# Patient Record
Sex: Male | Born: 1940 | Race: Black or African American | Hispanic: No | Marital: Single | Smoking: Former smoker
Health system: Southern US, Community
[De-identification: ages and names within clinical notes are randomized; demographics above are authoritative.]

## PROBLEM LIST (undated history)

## (undated) DIAGNOSIS — I10 Essential (primary) hypertension: Secondary | ICD-10-CM

## (undated) HISTORY — PX: EYE SURGERY: SHX253

## (undated) HISTORY — PX: NASAL SINUS SURGERY: SHX719

## (undated) HISTORY — DX: Essential (primary) hypertension: I10

---

## 2011-07-29 ENCOUNTER — Encounter: Payer: Self-pay | Admitting: Internal Medicine

## 2011-07-29 ENCOUNTER — Other Ambulatory Visit: Payer: Self-pay | Admitting: Gastroenterology

## 2011-07-29 ENCOUNTER — Inpatient Hospital Stay (HOSPITAL_COMMUNITY)
Admission: EM | Admit: 2011-07-29 | Discharge: 2011-08-01 | DRG: 379 | Disposition: A | Discharge status: Home / Self Care | Payer: Medicare Other | Source: Ambulatory Visit | Attending: Internal Medicine | Admitting: Internal Medicine

## 2011-07-29 DIAGNOSIS — Z79899 Other long term (current) drug therapy: Secondary | ICD-10-CM

## 2011-07-29 DIAGNOSIS — Z880 Allergy status to penicillin: Secondary | ICD-10-CM

## 2011-07-29 DIAGNOSIS — K5731 Diverticulosis of large intestine without perforation or abscess with bleeding: Principal | ICD-10-CM | POA: Diagnosis present

## 2011-07-29 DIAGNOSIS — Z23 Encounter for immunization: Secondary | ICD-10-CM

## 2011-07-29 DIAGNOSIS — K259 Gastric ulcer, unspecified as acute or chronic, without hemorrhage or perforation: Secondary | ICD-10-CM | POA: Diagnosis present

## 2011-07-29 DIAGNOSIS — M1710 Unilateral primary osteoarthritis, unspecified knee: Secondary | ICD-10-CM | POA: Diagnosis present

## 2011-07-29 DIAGNOSIS — I10 Essential (primary) hypertension: Secondary | ICD-10-CM | POA: Diagnosis present

## 2011-07-29 LAB — LIPID PANEL
HDL: 49 mg/dL (ref >39)
LDL Cholesterol: 75 mg/dL (ref 0–99)
VLDL: 6 mg/dL (ref 0–40)

## 2011-07-29 LAB — DIFFERENTIAL
Eosinophils Relative: 1 % (ref 0–5)
Lymphocytes Relative: 27 % (ref 12–46)
Lymphs Abs: 1.6 10*3/uL (ref 0.7–4.0)
Monocytes Absolute: 0.7 10*3/uL (ref 0.1–1.0)
Monocytes Relative: 12 % (ref 3–12)

## 2011-07-29 LAB — CBC
HCT: 28.5 % — ABNORMAL LOW (ref 39.0–52.0)
HCT: 27.8 % — ABNORMAL LOW (ref 39.0–52.0)
HCT: 28 % — ABNORMAL LOW (ref 39.0–52.0)
Hemoglobin: 9.3 g/dL — ABNORMAL LOW (ref 13.0–17.0)
Hemoglobin: 8.5 g/dL — ABNORMAL LOW (ref 13.0–17.0)
MCH: 29.6 pg (ref 26.0–34.0)
MCHC: 32.6 g/dL (ref 30.0–36.0)
MCHC: 32.5 g/dL (ref 30.0–36.0)
MCHC: 32.8 g/dL (ref 30.0–36.0)
MCV: 91.6 fL (ref 78.0–100.0)
Platelets: 145 10*3/uL — ABNORMAL LOW (ref 150–400)
Platelets: 137 10*3/uL — ABNORMAL LOW (ref 150–400)
RDW: 13.9 % (ref 11.5–15.5)
RDW: 13.8 % (ref 11.5–15.5)
RDW: 13.8 % (ref 11.5–15.5)
RDW: 13.7 % (ref 11.5–15.5)
WBC: 7.4 10*3/uL (ref 4.0–10.5)
WBC: 8.2 10*3/uL (ref 4.0–10.5)

## 2011-07-29 LAB — ABO/RH: ABO/RH(D): O POS

## 2011-07-29 LAB — COMPREHENSIVE METABOLIC PANEL
Albumin: 2.7 g/dL — ABNORMAL LOW (ref 3.5–5.2)
BUN: 26 mg/dL — ABNORMAL HIGH (ref 6–23)
Creatinine, Ser: 0.94 mg/dL (ref 0.50–1.35)
GFR calc Af Amer: >90 mL/min (ref >90)
Total Bilirubin: 0.2 mg/dL — ABNORMAL LOW (ref 0.3–1.2)
Total Protein: 4.9 g/dL — ABNORMAL LOW (ref 6.0–8.3)

## 2011-07-29 LAB — APTT: aPTT: 26 seconds (ref 24–37)

## 2011-07-29 LAB — PROTIME-INR
INR: 1.16 (ref 0.00–1.49)
Prothrombin Time: 15 seconds (ref 11.6–15.2)

## 2011-07-29 LAB — HEMOGLOBIN A1C
Hgb A1c MFr Bld: 5.8 % — ABNORMAL HIGH (ref <5.7)
Mean Plasma Glucose: 120 mg/dL — ABNORMAL HIGH (ref <117)

## 2011-07-29 LAB — TSH: TSH: 0.544 u[IU]/mL (ref 0.350–4.500)

## 2011-07-29 LAB — BASIC METABOLIC PANEL
Calcium: 8 mg/dL — ABNORMAL LOW (ref 8.4–10.5)
GFR calc Af Amer: >90 mL/min (ref >90)
GFR calc non Af Amer: 89 mL/min — ABNORMAL LOW (ref >90)
Potassium: 3.3 mEq/L — ABNORMAL LOW (ref 3.5–5.1)
Sodium: 139 mEq/L (ref 135–145)

## 2011-07-29 LAB — CK TOTAL AND CKMB (NOT AT ARMC): Relative Index: 1.5 (ref 0.0–2.5)

## 2011-07-29 LAB — CARDIAC PANEL(CRET KIN+CKTOT+MB+TROPI)
CK, MB: 3.8 ng/mL (ref 0.3–4.0)
Relative Index: 1.5 (ref 0.0–2.5)
Total CK: 248 U/L — ABNORMAL HIGH (ref 7–232)

## 2011-07-29 LAB — LIPASE, BLOOD: Lipase: 24 U/L (ref 11–59)

## 2011-07-29 LAB — MRSA PCR SCREENING: MRSA by PCR: NEGATIVE

## 2011-07-29 NOTE — H&P (Signed)
Hospital Admission Note Date: 07/29/2011  Patient name:  Luis Sellers   Medical record number:  [id number] Date of birth:  1940-12-24  Age: 70 y.o. Gender:  male PCP:    No primary provider on file.  Medical Service:   Internal Medicine Teaching Service   Attending physician:  Dr. Cliffton Asters First Contact:   Dr. Manson Passey  Pager: [phone]  Second Contact:   Dr. Saralyn Pilar Pager: [phone] After Hours:    First Contact   Pager: [phone]      Second Contact  Pager: [phone]   Chief Complaint: BRBPR  History of Present Illness: Patient is a 70 y.o. male with a PMHx of HTN, who presents to [facility] for evaluation of BRBPR. He was perfectly fine until yesterday afternoon when he noticed dark brown and intermittent bright red blood mixed in his stool. Since then he had 3 other bowel movements with fresh blood in all of them. The bleeding did decrease for a while it seemed like it was increasing again today. He has intermittent constipation but he was having loose stool and was not constipated prior to the onset of symptoms. He did not feel any pain in his belly or rectal area when he wiped after passing a bowel movement. He did feel lightheaded after he walked into the ER this morning but not otherwise. He denies any chest pain, shortness of breath, fainting spells, fatigue or nausea or vomiting. He denies history of acid reflux. 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 He denies blood in stool or dark brown stool before. He denies weight loss. He has not had anything to eat since yesterday afternoon because he didn't feel like it. He never had a colonoscopy before. He does not take any medicine except Advil at home. He does not have any bleeding from his nose, skin or any other site.  Current Outpatient Medications: Advil: 2 tablets a day for past many years for arthritis  Allergies: Allergies not on file   Past Medical  History: Past Medical History  Diagnosis Date  . Hypertension     not on any antihypertensives: Was diagnosed by an urgent care center and patient did start taking an antihypertensive prescribed by them for some time but stopped after that. He does not remember the name of the antihypertensive.   -   arthritis of knees     Past Surgical History: Surgery for nasal blockage in remote past  Family History: Positive for diabetes in his father and neck cancer in mother. No history of colon cancer in the family.  Social History:  Lives in [place] . Single. 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 Has Medicare. Denies current smoking, alcohol or illicit drug use. Quit smoking and alcohol 25 years ago  Review of Systems: Constitutional:  Denies fever, chills, diaphoresis, appetite change and fatigue.  HEENT: Denies photophobia, eye pain, redness, hearing loss, ear pain, congestion, sore throat, rhinorrhea, sneezing, mouth sores, trouble swallowing, neck pain, neck stiffness and tinnitus.  Respiratory: Denies SOB, DOE, cough, chest tightness, and wheezing.  Cardiovascular: Denies chest pain, palpitations and leg swelling.  Gastrointestinal: Denies nausea, vomiting, abdominal pain, diarrhea, constipation, and abdominal distention.  Genitourinary: Denies dysuria, urgency, frequency, hematuria, flank pain and difficulty urinating.  Musculoskeletal: Denies myalgias, back pain, joint swelling, arthralgias and gait problem.   Skin: Denies pallor, rash and wound.  Neurological: Denies dizziness, seizures, syncope, weakness, light-headedness,  numbness and headaches.   Hematological: Denies adenopathy. Easy bruising, personal or family bleeding history.  Psychiatric/ Behavioral: Denies suicidal ideation, mood changes, confusion, nervousness, sleep disturbance and agitation.    Vital Signs: T: 97.8 P: 66-76 BP: 107-136/50-90 RR: 13 O2 sat: 100% RA   Physical Exam: General:  Vital signs reviewed and noted. Well-developed, well-nourished, in no acute distress; alert, appropriate and cooperative throughout examination.  Head: Normocephalic, atraumatic.  Eyes: PERRL, EOMI, mucous membranes pale   Nose: Mucous membranes moist, not inflammed, nonerythematous.  Throat: Oropharynx nonerythematous, no exudate appreciated.   Neck: No deformities, masses, or tenderness noted.Supple, No carotid Bruits, no JVD.  Lungs:  Normal respiratory effort. Clear to auscultation BL without crackles or wheezes.  Heart: RRR. S1 and S2 normal without gallop, murmur, or rubs.  Abdomen:  BS normoactive. Soft, Nondistended, non-tender.  No masses or organomegaly.  Extremities: No pretibial edema.  Neurologic: A&O X3, CN II - XII are grossly intact. Motor strength is 5/5 in the all 4 extremities, Sensations intact to light touch, Cerebellar signs negative.  Rectal Non tender, no masses, gross blood in rectum  Skin: No visible rashes, scars.   Lab results: Basic Metabolic Panel: Recent Labs  [facility] 07/29/11 0735   NA 140   K 3.4*   CL 107   CO2 26   GLUCOSE 166*   BUN 26*   CREATININE 0.94   CALCIUM 8.2*   MG --   PHOS --   Liver Function Tests: Recent Labs  [facility] 07/29/11 0735   AST 18   ALT 16   ALKPHOS 61   BILITOT 0.2*   PROT 4.9*   ALBUMIN 2.7*   Recent Labs  [facility] 07/29/11 0735   LIPASE 24   AMYLASE --   CBC: Recent Labs  [facility] 07/29/11 0735   WBC 5.9   NEUTROABS 3.5   HGB 9.3*   HCT 28.5*   MCV 91.6   PLT 158    Imaging results:  No results found.   Other results: EKG: T wave inversions in V1, V2 and V3 and PVCs  Problem list  #1: Pain less hematochezia with no evidence of hemorrhoids on rectal exam.  #2: Hypertension #3: T wave inversions in the anteroseptal leads #4: Arthritis of the knee  Assessment & Plan: 70 year old man with past medical history of uncontrolled hypertension and arthritis for which he takes 2 Advils every day  comes to the ED with one day history of painless bright red blood per rectum which is persistent. His current hemoglobin is 9 with no baseline hemoglobin on record. Physical exam shows tachycardia, pale mucous membranes and bright red blood in rectum without active bleeding. EKG shows T-wave inversions in anteroseptal leads without a baseline EKG to compare it with.  #1: GI bleed-given evidence of fresh blood in rectum this is most likely a lower GI bleed but a profuse upper GI bleed is also a possibility given his history of NSAID use. He is hemodynamically stable at this time. -Transfer to telemetry bed -Placed 2 large bore IVs and transfuse 2 units of PRBC. -IV fluid hydration at 100 cc per hour. -PPI drip has already been started in the emergency department, continue that. -Keep n.p.o. in preparation for a GI procedure. GI department has already been consulted. -Check coagulation parameters.  #2: hypertension -no antihypertensives at this time.  #3: T wave inversions in V1 to V3-concerning for an anteroseptal infarct in setting of GI bleed. He also has uncontrolled hypertension as  a risk factor. -No aspirin or anticoagulation at this time given acute GI bleed -Cycle EKG and cardiac enzyme. Monitor on telemetry for arrhythmias. -Check lipid profile, HbA1c and TSH for risk stratification -Does require better blood pressure control as outpatient. Will counsel him at the time of discharge  #4: Arthritis of the knees-morphine for pain control. No NSAIDs  #5: DVT prophylaxis-SCDs in setting of GI bleed.        Bethel Born, M.D. (PGY3):  ____________________________________    Date/ Time:     ____________________________________        I have seen and examined the patient. I reviewed the resident/fellow note and agree with the findings and plan of care as documented. My additions and revisions are included.   Signature:  ____________________________________________     Internal  Medicine Teaching Service Attending    Date:    ____________________________________________

## 2011-07-30 DIAGNOSIS — K922 Gastrointestinal hemorrhage, unspecified: Secondary | ICD-10-CM

## 2011-07-30 LAB — CBC
HCT: 30.1 % — ABNORMAL LOW (ref 39.0–52.0)
HCT: 27.9 % — ABNORMAL LOW (ref 39.0–52.0)
Hemoglobin: 10.8 g/dL — ABNORMAL LOW (ref 13.0–17.0)
Hemoglobin: 9.4 g/dL — ABNORMAL LOW (ref 13.0–17.0)
MCH: 29.8 pg (ref 26.0–34.0)
MCH: 29.6 pg (ref 26.0–34.0)
MCHC: 33.6 g/dL (ref 30.0–36.0)
MCHC: 33.2 g/dL (ref 30.0–36.0)
MCV: 89.2 fL (ref 78.0–100.0)
Platelets: 147 10*3/uL — ABNORMAL LOW (ref 150–400)
Platelets: 138 10*3/uL — ABNORMAL LOW (ref 150–400)
Platelets: 151 10*3/uL (ref 150–400)
RBC: 3.63 MIL/uL — ABNORMAL LOW (ref 4.22–5.81)
RBC: 3.37 MIL/uL — ABNORMAL LOW (ref 4.22–5.81)
RBC: 3.14 MIL/uL — ABNORMAL LOW (ref 4.22–5.81)
RDW: 14.7 % (ref 11.5–15.5)
RDW: 14.6 % (ref 11.5–15.5)
WBC: 9.1 10*3/uL (ref 4.0–10.5)

## 2011-07-30 LAB — BASIC METABOLIC PANEL
CO2: 25 mEq/L (ref 19–32)
Calcium: 8.6 mg/dL (ref 8.4–10.5)
Creatinine, Ser: 0.78 mg/dL (ref 0.50–1.35)
GFR calc non Af Amer: 89 mL/min — ABNORMAL LOW (ref >90)
Glucose, Bld: 97 mg/dL (ref 70–99)
Sodium: 142 mEq/L (ref 135–145)

## 2011-07-30 LAB — CARDIAC PANEL(CRET KIN+CKTOT+MB+TROPI)
CK, MB: 3.6 ng/mL (ref 0.3–4.0)
Total CK: 237 U/L — ABNORMAL HIGH (ref 7–232)

## 2011-07-31 LAB — BASIC METABOLIC PANEL
BUN: 9 mg/dL (ref 6–23)
CO2: 30 mEq/L (ref 19–32)
Chloride: 102 mEq/L (ref 96–112)
Creatinine, Ser: 0.94 mg/dL (ref 0.50–1.35)
Glucose, Bld: 91 mg/dL (ref 70–99)

## 2011-07-31 LAB — CBC
HCT: 29.3 % — ABNORMAL LOW (ref 39.0–52.0)
HCT: 33.5 % — ABNORMAL LOW (ref 39.0–52.0)
Hemoglobin: 9.7 g/dL — ABNORMAL LOW (ref 13.0–17.0)
MCH: 29.4 pg (ref 26.0–34.0)
MCHC: 32.5 g/dL (ref 30.0–36.0)
MCV: 89.6 fL (ref 78.0–100.0)
MCV: 90.3 fL (ref 78.0–100.0)
RBC: 3.27 MIL/uL — ABNORMAL LOW (ref 4.22–5.81)
RDW: 14.2 % (ref 11.5–15.5)
WBC: 7.4 10*3/uL (ref 4.0–10.5)
WBC: 9.7 10*3/uL (ref 4.0–10.5)

## 2011-08-01 ENCOUNTER — Encounter: Payer: Self-pay | Admitting: Internal Medicine

## 2011-08-01 LAB — CBC
Hemoglobin: 10.6 g/dL — ABNORMAL LOW (ref 13.0–17.0)
MCH: 29.2 pg (ref 26.0–34.0)
MCHC: 32.4 g/dL (ref 30.0–36.0)
RDW: 14.1 % (ref 11.5–15.5)

## 2011-08-01 NOTE — Progress Notes (Signed)
Work note for excused absence 07/28/11 - 08/02/11 - see communications.

## 2011-08-02 LAB — TYPE AND SCREEN
Antibody Screen: NEGATIVE
Unit division: 0

## 2011-08-05 NOTE — Consult Note (Signed)
NAME:  Luis Sellers, Luis Sellers NO.:  [id number]  MEDICAL RECORD NO.:  [id number]  LOCATION:  2906                         FACILITY:  [facility]  PHYSICIAN:  Bernette Redbird, M.D.   DATE OF BIRTH:  04/20/1941  DATE OF CONSULTATION:  07/29/2011 DATE OF DISCHARGE:                                CONSULTATION   Asked [place] to see this very pleasant 70 year old African American male with no primary physician as an unassigned patient because of GI bleeding.  The patient came into the emergency room because he had several episodes of bloody bowel movements, somewhat mix was stool, but mostly just pure blood, beginning yesterday, without syncope or abdominal pain.  He has never had GI bleeding.  He has been using Advil twice a day for a while because of arthritic pain.  In the emergency room, he had a mild elevation of BUN at 26, hemoglobin 9.3.  PAST MEDICAL HISTORY:  Allergy to PENICILLIN.  OUTPATIENT MEDICATIONS:  None.  OPERATIONS:  None. CHRONIC MEDICAL ILLNESSES:  Borderline high blood pressure, not treated. No known cardiopulmonary disease.  HABITS:  Nonsmoker, nondrinker.  FAMILY HISTORY:  Negative for GI illnesses such as colon cancer, gallstones, ulcer disease or liver disease.  SOCIAL HISTORY:  The patient is still working at [employer] doing [employer].  REVIEW OF SYSTEMS:  Negative for upper tract symptoms such as anorexia, weight loss, dysphagia, abdominal pain, nausea, vomiting, antecedent lower tract symptoms such as constipation, diarrhea or rectal bleeding.  PHYSICAL EXAMINATION:  VITALS:  Unremarkable with blood pressure 187/84, pulse 72, afebrile.  Anicteric, no evident pallor.  CHEST:  Clear. HEART:  Normal. ABDOMEN:  Without organomegaly, guarding, mass, or tenderness.  LABORATORY DATA:  White count 8200, hemoglobin 9.1, stable over roughly 12 hours here in the hospital, platelets 145,000 MCV normal INR normal at 1.2.  Admission chemistry  panel pertinent for BUN 26 with creatinine of 0.94.  Normal liver chemistries.  Albumin 2.7, hemoglobin A1c 5.8. Lipase normal at 24.  Troponin negative, although CK was mildly elevated at 289.  Lipid study shows cholesterol low at 130.  IMPRESSION:  Painless hematochezia in a patient with no known prior GI bleeding history.  He has not had a colonoscopy, so we do not know if he has diverticular disease.  This sounds clinically like the lower gastrointestinal tract bleed, although a slight elevation of BUN.  In the fact, he has been on Advil suggest at least the possibility of an upper tract source.  PLAN:  Endoscopic evaluation, the nature, purpose and risks of which were reviewed with the patient and he is agreeable to proceed.  If that test is negative, I would treat as for lower tract bleeding with observation and gentle laxation.  I would consider colonoscopy during this admission for persistent active bleeding, but if he stops on his own, I would probably observe him  and perform colonoscopy as an outpatient when he is more fully recovered.          ______________________________ Bernette Redbird, M.D.     RB/MEDQ  D:  07/29/2011  T:  07/30/2011  Job:  [id number]  cc:   Cliffton Asters, M.D.  Electronically Signed  by Bernette Redbird M.D. on 08/05/2011 02:49:24 PM

## 2011-08-06 ENCOUNTER — Ambulatory Visit (INDEPENDENT_AMBULATORY_CARE_PROVIDER_SITE_OTHER): Payer: Medicare Other | Admitting: Internal Medicine

## 2011-08-06 ENCOUNTER — Encounter: Payer: Self-pay | Admitting: Internal Medicine

## 2011-08-06 ENCOUNTER — Encounter: Payer: Medicare Other | Admitting: Internal Medicine

## 2011-08-06 VITALS — BP 130/80 | HR 72 | Temp 98.3°F | Ht 69.0 in | Wt 257.8 lb

## 2011-08-06 DIAGNOSIS — Z23 Encounter for immunization: Secondary | ICD-10-CM

## 2011-08-06 DIAGNOSIS — I10 Essential (primary) hypertension: Secondary | ICD-10-CM

## 2011-08-06 MED ORDER — BENAZEPRIL-HYDROCHLOROTHIAZIDE 20-25 MG PO TABS: 1.0000 | ORAL_TABLET | Freq: Every day | ORAL | Status: AC

## 2011-08-06 NOTE — Patient Instructions (Signed)
Please, do not take any ASA-containing medications prior to your visit with a gastroenterologist. Please, stop taking Hydrochlorothiazide and start taking benazepril with hydrocholorothiazide

## 2011-08-09 NOTE — Progress Notes (Signed)
Subjective:   Patient ID: Quasean Frye male   DOB: 07-16-1941 70 y.o.   MRN: [id number]  HPI: Mr.Traeton Haapala is a 70 y.o.  Pleasant man is here for a hospital follow up. Patient was admitted for a rectal bleed r/t diverticuloses. Please, refer to D/C summary. Patient reports feeling well. Denies any dizziness, melena, BRBP, weakness or any other Sx.    Past Medical History  Diagnosis Date  . Hypertension     not on any antihypertensives   Current Outpatient Prescriptions  Medication Sig Dispense Refill  . omeprazole (PRILOSEC) 20 MG capsule Take 20 mg by mouth daily.        . benazepril-hydrochlorthiazide (LOTENSIN HCT) 20-25 MG per tablet Take 1 tablet by mouth daily.  30 tablet  11   No family history on file. History   Social History  . Marital Status: Single    Spouse Name: N/A    Number of Children: N/A  . Years of Education: N/A   Social History Main Topics  . Smoking status: Former Smoker    Types: Cigarettes  . Smokeless tobacco: Not on file  . Alcohol Use: Not on file  . Drug Use: Not on file  . Sexually Active: Not on file   Other Topics Concern  . Not on file   Social History Narrative  . No narrative on file   Review of Systems: Constitutional: Denies fever, chills, diaphoresis, appetite change and fatigue.  HEENT: Denies photophobia, eye pain, redness, hearing loss, ear pain, congestion, sore throat, rhinorrhea, sneezing, mouth sores, trouble swallowing, neck pain, neck stiffness and tinnitus.   Respiratory: Denies SOB, DOE, cough, chest tightness,  and wheezing.   Cardiovascular: Denies chest pain, palpitations and leg swelling.  Gastrointestinal: Denies nausea, vomiting, abdominal pain, diarrhea, constipation, blood in stool and abdominal distention.  Genitourinary: Denies dysuria, urgency, frequency, hematuria, flank pain and difficulty urinating.  Musculoskeletal: Denies myalgias, back pain, joint swelling, arthralgias and gait problem.  Skin:  Denies pallor, rash and wound.  Neurological: Denies dizziness, seizures, syncope, weakness, light-headedness, numbness and headaches.  Hematological: Denies adenopathy. Easy bruising, personal or family bleeding history  Psychiatric/Behavioral: Denies suicidal ideation, mood changes, confusion, nervousness, sleep disturbance and agitation  Objective:  Physical Exam: Filed Vitals:   08/06/11 1414  BP: 130/80  Pulse: 72  Temp: 98.3 F (36.8 C)  TempSrc: Oral  Height: 5\' 9"  (1.753 m)  Weight: 257 lb 12.8 oz (116.937 kg)   Constitutional: Vital signs reviewed.  Patient is a well-developed and well-nourished gentleman in no acute distress and cooperative with exam. Alert and oriented x3.  Head: Normocephalic and atraumatic Ear: TM normal bilaterally Mouth: no erythema or exudates, MMM Eyes: PERRL, EOMI, conjunctivae normal, No scleral icterus.  Neck: Supple, Trachea midline normal ROM, No JVD, mass, thyromegaly, or carotid bruit present.  Cardiovascular: RRR, S1 normal, S2 normal, no MRG, pulses symmetric and intact bilaterally Pulmonary/Chest: CTAB, no wheezes, rales, or rhonchi Abdominal: Soft. Non-tender, non-distended, bowel sounds are normal, no masses, organomegaly, or guarding present.  GU: no CVA tenderness Musculoskeletal: No joint deformities, erythema, or stiffness, ROM full and no nontender Hematology: no cervical, inginal, or axillary adenopathy.  Neurological: A&O x3, Strenght is normal and symmetric bilaterally, cranial nerve II-XII are grossly intact, no focal motor deficit, sensory intact to light touch bilaterally.  Skin: Warm, dry and intact. No rash, cyanosis, or clubbing.  Psychiatric: Normal mood and affect. speech and behavior is normal. Judgment and thought content normal. Cognition and memory are normal.  Assessment & Plan:    1. HTN -Will add ACEI to HCTZ -diet, exercise discussed with the patient.  2. Hx of recent diverticular bleed -GI appointment on  08/09/11 -d/C ASA prior to a colonoscopy.

## 2011-08-09 NOTE — Progress Notes (Deleted)
  Subjective:    Patient ID: Luis Sellers, male    DOB: 1941-02-24, 70 y.o.   MRN: [id number]  HPI    Review of Systems     Objective:   Physical Exam        Assessment & Plan:

## 2011-08-10 NOTE — Discharge Summary (Signed)
NAME:  Luis Sellers, Luis Sellers NO.:  [id number]  MEDICAL RECORD NO.:  [id number]  LOCATION:  5159                         FACILITY:  [facility]  PHYSICIAN:  Cliffton Asters, M.D.    DATE OF BIRTH:  1940-11-29  DATE OF ADMISSION:  07/29/2011 DATE OF DISCHARGE:  08/01/2011                              DISCHARGE SUMMARY   DISCHARGE DIAGNOSES: 1. Gastrointestinal bleed - EGD shows nonbleeding ulcer thought to be     diverticular in origin, but etiology unclear, followup EGD and     colonoscopy with outpatient GI. 2. Hypertension. 3. Arthritis.  DISCHARGE MEDICATIONS: 1. Hydrochlorothiazide 25 mg by mouth daily. 2. Omeprazole 20 mg by mouth daily. 3. Multivitamin one tablet by mouth daily.  DISPOSITION AND FOLLOWUP:  The patient was discharged from [facility] on August 01, 2011, in stable and improved condition.  The patient will follow up in the [facility] on August 06, 2011, at 9:45 a.m. with Dr. Denton Meek at which time, a hemoglobin should be checked to ensure stabilization, and the patient should be asked about further bleeding per rectum.  The patient's high blood pressure can also be addressed at this visit and additional medications may be added to the patient's hydrochlorothiazide as needed.  The patient will follow up with Gastroenterology on an outpatient basis for a repeat EGD and colonoscopy in the future.  PROCEDURES PERFORMED:  Endoscopy on July 29, 2011, showed 4 x 15 mm ulcer at the angular with no bleeding from ulcer.  CONSULTATION:  Gastroenterology.  ADMITTING HISTORY AND PHYSICAL:  The patient is a 70 year old man with a history of hypertension, presenting to [facility] for evaluation of bright red blood per rectum.  He was in his usual state of health until the afternoon prior to admission when he noticed dark brown and intermittent bright red blood mix in his stool.  Since then, he has had 3 further bowel movements  with fresh blood in all of them.  The bleeding decreased for a while, but increased again the following morning.  He notes that he had intermittent constipation, but he is having loose stool at the time of having blood in his stool.  He did not feel any abdominal or rectal pain at the time.  He did feel lightheaded on the morning of admission, but not otherwise.  He denies any chest pain, shortness of breath, fainting spells, fatigue, nausea or vomiting.  He denies history of acid reflux.  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  He denies blood in the stool or dark brown stool before.  He denies weight loss.  He has not had anything to eat since yesterday afternoon because he did not feel like it.  He has never had a colonoscopy.  He does not take any other medications.  He does not have any bleeding from his nose, skin, or any other site.  PHYSICAL EXAMINATION:  VITAL SIGNS:  Temperature 97.8, pulse 66-76, blood pressure 107-136/50-90, respirations 13, and oxygen saturations 100% on room air. GENERAL:  The patient is alert, cooperative, and in no acute  distress. HEENT:  Pupils equal, round and reactive to light.  Mucous membranes moist.  Oropharynx, nonerythematous. LUNGS:  Normal respiratory effort.  Clear to auscultation bilaterally with no wheezes or crackles. HEART:  Regular rate and rhythm.  Normal S1, S2.  No murmurs, gallops, or rubs. ABDOMEN:  Soft, nontender, nondistended.  No masses or organomegaly. EXTREMITIES:  No cyanosis, clubbing, or edema. NEUROLOGIC:  Alert, and oriented x3.  Cranial nerves II through XII grossly intact.  Strength is 5/5 in all 4 extremities.  Sensation is intact to light touch.  Cerebellar sign is negative. SKIN:  No visible rashes or scars.  ADMITTING LABS:  Sodium 140, potassium 3.4, chloride 107, bicarb 26, BUN 26, creatinine 0.94, glucose 166, lipase 24.  WBC 5.9,  hemoglobin 9.3, hematocrit 28.5, platelets 158.  HOSPITAL COURSE BY PROBLEM: 1. GI bleed.  The patient presents with a 1-day history of blood in     the stool and bright red blood per rectum.  His admitting     hemoglobin was 9.3 with no prior hemoglobin on record.  The patient     notes taking 2 Advil/day consistently for arthritis pain.  The     patient has had no prior colonoscopy or EGD.  An EGD was performed     on day 1 of hospitalization which showed a nonbleeding upper GI     ulcer, but no other obvious source of the bleed.  The patient's     hemoglobin remained stable during hospitalization and the patient's     episodes of blood in his stool resolved over the course of     hospitalization.  The origin of the bleed is unclear in etiology,     but likely results from a diverticular bleed.  The patient will     follow up with GI on an outpatient basis for full EGD and     colonoscopy.  The patient will also followup in outpatient clinic     to ensure stabilization of hemoglobin.  The patient was also given     stool softeners to prevent constipation, which may exacerbate     diverticular bleed.  The patient was encouraged to switch from     Advil to Tylenol for management of his arthritis and pain. 2. Hypertension.  The patient notes that he had been given a diagnosis     of hypertension in the past and had previously been on     antihypertensive medications, though he is unsure the name of thes     medications and had not used any such medication in over a year.     During hospitalization, the patient's blood pressure rose to the     160s systolic and the patient was started on hydrochlorothiazide 25     mg daily.  The patient will follow up at the [facility] and this issue will be further managed on an outpatient     basis.  DISCHARGE LABS:  WBC 7.9, hemoglobin 10.6, hematocrit 32.7, and platelets 164.  DISCHARGE VITALS:  Temperature 98.2, blood  pressure 140/61, pulse 75, and respirations 18.    ______________________________ Janalyn Harder, MD   ______________________________ Cliffton Asters, M.D.    RB/MEDQ  D:  08/02/2011  T:  08/02/2011  Job:  [id number]  cc:   Deatra Robinson, MD  Electronically Signed by Janalyn Harder MD on 08/09/2011 03:43:19 PM Electronically Signed by Cliffton Asters M.D. on 08/10/2011 02:37:44 PM

## 2016-06-19 ENCOUNTER — Emergency Department (HOSPITAL_COMMUNITY): Payer: Medicare Other

## 2016-06-19 ENCOUNTER — Inpatient Hospital Stay (HOSPITAL_COMMUNITY): Payer: Medicare Other | Admitting: Certified Registered"

## 2016-06-19 ENCOUNTER — Inpatient Hospital Stay (HOSPITAL_COMMUNITY)
Admission: EM | Admit: 2016-06-19 | Discharge: 2016-07-11 | DRG: 270 | Disposition: E | Payer: Medicare Other | Attending: Vascular Surgery | Admitting: Vascular Surgery

## 2016-06-19 ENCOUNTER — Encounter (HOSPITAL_COMMUNITY): Payer: Self-pay | Admitting: Emergency Medicine

## 2016-06-19 ENCOUNTER — Inpatient Hospital Stay (HOSPITAL_COMMUNITY): Payer: Medicare Other

## 2016-06-19 ENCOUNTER — Encounter (HOSPITAL_COMMUNITY): Admission: EM | Disposition: E | Payer: Self-pay | Source: Home / Self Care | Attending: Vascular Surgery

## 2016-06-19 DIAGNOSIS — R739 Hyperglycemia, unspecified: Secondary | ICD-10-CM | POA: Diagnosis not present

## 2016-06-19 DIAGNOSIS — I639 Cerebral infarction, unspecified: Secondary | ICD-10-CM

## 2016-06-19 DIAGNOSIS — I4901 Ventricular fibrillation: Secondary | ICD-10-CM | POA: Diagnosis not present

## 2016-06-19 DIAGNOSIS — I998 Other disorder of circulatory system: Secondary | ICD-10-CM | POA: Diagnosis present

## 2016-06-19 DIAGNOSIS — E785 Hyperlipidemia, unspecified: Secondary | ICD-10-CM

## 2016-06-19 DIAGNOSIS — R278 Other lack of coordination: Secondary | ICD-10-CM | POA: Diagnosis present

## 2016-06-19 DIAGNOSIS — D6851 Activated protein C resistance: Secondary | ICD-10-CM | POA: Diagnosis present

## 2016-06-19 DIAGNOSIS — E876 Hypokalemia: Secondary | ICD-10-CM | POA: Diagnosis present

## 2016-06-19 DIAGNOSIS — I480 Paroxysmal atrial fibrillation: Secondary | ICD-10-CM

## 2016-06-19 DIAGNOSIS — I749 Embolism and thrombosis of unspecified artery: Secondary | ICD-10-CM | POA: Diagnosis present

## 2016-06-19 DIAGNOSIS — J9601 Acute respiratory failure with hypoxia: Secondary | ICD-10-CM | POA: Diagnosis not present

## 2016-06-19 DIAGNOSIS — Z6841 Body Mass Index (BMI) 40.0 and over, adult: Secondary | ICD-10-CM

## 2016-06-19 DIAGNOSIS — I63113 Cerebral infarction due to embolism of bilateral vertebral arteries: Secondary | ICD-10-CM

## 2016-06-19 DIAGNOSIS — D696 Thrombocytopenia, unspecified: Secondary | ICD-10-CM | POA: Diagnosis present

## 2016-06-19 DIAGNOSIS — W19XXXA Unspecified fall, initial encounter: Secondary | ICD-10-CM | POA: Diagnosis present

## 2016-06-19 DIAGNOSIS — E875 Hyperkalemia: Secondary | ICD-10-CM

## 2016-06-19 DIAGNOSIS — I82401 Acute embolism and thrombosis of unspecified deep veins of right lower extremity: Secondary | ICD-10-CM | POA: Diagnosis not present

## 2016-06-19 DIAGNOSIS — I739 Peripheral vascular disease, unspecified: Secondary | ICD-10-CM | POA: Diagnosis present

## 2016-06-19 DIAGNOSIS — J9600 Acute respiratory failure, unspecified whether with hypoxia or hypercapnia: Secondary | ICD-10-CM

## 2016-06-19 DIAGNOSIS — I10 Essential (primary) hypertension: Secondary | ICD-10-CM | POA: Diagnosis not present

## 2016-06-19 DIAGNOSIS — I7409 Other arterial embolism and thrombosis of abdominal aorta: Secondary | ICD-10-CM | POA: Diagnosis present

## 2016-06-19 DIAGNOSIS — I748 Embolism and thrombosis of other arteries: Secondary | ICD-10-CM | POA: Diagnosis present

## 2016-06-19 DIAGNOSIS — I745 Embolism and thrombosis of iliac artery: Secondary | ICD-10-CM

## 2016-06-19 DIAGNOSIS — N179 Acute kidney failure, unspecified: Secondary | ICD-10-CM | POA: Diagnosis present

## 2016-06-19 DIAGNOSIS — R7989 Other specified abnormal findings of blood chemistry: Secondary | ICD-10-CM | POA: Diagnosis not present

## 2016-06-19 DIAGNOSIS — N280 Ischemia and infarction of kidney: Secondary | ICD-10-CM | POA: Diagnosis present

## 2016-06-19 DIAGNOSIS — I119 Hypertensive heart disease without heart failure: Secondary | ICD-10-CM | POA: Diagnosis present

## 2016-06-19 DIAGNOSIS — I671 Cerebral aneurysm, nonruptured: Secondary | ICD-10-CM | POA: Diagnosis present

## 2016-06-19 DIAGNOSIS — I4891 Unspecified atrial fibrillation: Secondary | ICD-10-CM

## 2016-06-19 DIAGNOSIS — K55049 Acute infarction of large intestine, extent unspecified: Secondary | ICD-10-CM | POA: Diagnosis present

## 2016-06-19 DIAGNOSIS — R29704 NIHSS score 4: Secondary | ICD-10-CM | POA: Diagnosis present

## 2016-06-19 DIAGNOSIS — R402143 Coma scale, eyes open, spontaneous, at hospital admission: Secondary | ICD-10-CM | POA: Diagnosis present

## 2016-06-19 DIAGNOSIS — I6340 Cerebral infarction due to embolism of unspecified cerebral artery: Secondary | ICD-10-CM | POA: Diagnosis present

## 2016-06-19 DIAGNOSIS — I82402 Acute embolism and thrombosis of unspecified deep veins of left lower extremity: Secondary | ICD-10-CM | POA: Diagnosis not present

## 2016-06-19 DIAGNOSIS — I469 Cardiac arrest, cause unspecified: Secondary | ICD-10-CM | POA: Diagnosis not present

## 2016-06-19 DIAGNOSIS — J9690 Respiratory failure, unspecified, unspecified whether with hypoxia or hypercapnia: Secondary | ICD-10-CM

## 2016-06-19 DIAGNOSIS — R402363 Coma scale, best motor response, obeys commands, at hospital admission: Secondary | ICD-10-CM | POA: Diagnosis present

## 2016-06-19 DIAGNOSIS — R531 Weakness: Secondary | ICD-10-CM | POA: Diagnosis present

## 2016-06-19 DIAGNOSIS — I743 Embolism and thrombosis of arteries of the lower extremities: Principal | ICD-10-CM | POA: Diagnosis present

## 2016-06-19 DIAGNOSIS — R109 Unspecified abdominal pain: Secondary | ICD-10-CM

## 2016-06-19 DIAGNOSIS — M6282 Rhabdomyolysis: Secondary | ICD-10-CM | POA: Diagnosis present

## 2016-06-19 DIAGNOSIS — E872 Acidosis: Secondary | ICD-10-CM | POA: Diagnosis present

## 2016-06-19 DIAGNOSIS — I712 Thoracic aortic aneurysm, without rupture: Secondary | ICD-10-CM | POA: Diagnosis present

## 2016-06-19 DIAGNOSIS — I82601 Acute embolism and thrombosis of unspecified veins of right upper extremity: Secondary | ICD-10-CM | POA: Diagnosis not present

## 2016-06-19 DIAGNOSIS — R402253 Coma scale, best verbal response, oriented, at hospital admission: Secondary | ICD-10-CM | POA: Diagnosis present

## 2016-06-19 DIAGNOSIS — Z880 Allergy status to penicillin: Secondary | ICD-10-CM

## 2016-06-19 DIAGNOSIS — E162 Hypoglycemia, unspecified: Secondary | ICD-10-CM | POA: Diagnosis not present

## 2016-06-19 DIAGNOSIS — R0603 Acute respiratory distress: Secondary | ICD-10-CM

## 2016-06-19 DIAGNOSIS — Z419 Encounter for procedure for purposes other than remedying health state, unspecified: Secondary | ICD-10-CM

## 2016-06-19 DIAGNOSIS — Z87891 Personal history of nicotine dependence: Secondary | ICD-10-CM

## 2016-06-19 DIAGNOSIS — Z418 Encounter for other procedures for purposes other than remedying health state: Secondary | ICD-10-CM

## 2016-06-19 HISTORY — PX: THROMBECTOMY AND REVISION OF ARTERIOVENTOUS (AV) GORETEX  GRAFT: SHX6120

## 2016-06-19 LAB — PROTIME-INR
INR: 1.15
Prothrombin Time: 14.8 seconds (ref 11.4–15.2)

## 2016-06-19 LAB — COMPREHENSIVE METABOLIC PANEL
ALT: 14 U/L — ABNORMAL LOW (ref 17–63)
AST: 25 U/L (ref 15–41)
Albumin: 3.5 g/dL (ref 3.5–5.0)
Alkaline Phosphatase: 86 U/L (ref 38–126)
Anion gap: 10 (ref 5–15)
BUN: 19 mg/dL (ref 6–20)
CO2: 19 mmol/L — ABNORMAL LOW (ref 22–32)
Calcium: 8.9 mg/dL (ref 8.9–10.3)
Chloride: 110 mmol/L (ref 101–111)
Creatinine, Ser: 1.11 mg/dL (ref 0.61–1.24)
GFR calc Af Amer: >60 mL/min (ref >60)
GFR calc non Af Amer: >60 mL/min (ref >60)
Glucose, Bld: 195 mg/dL — ABNORMAL HIGH (ref 65–99)
Potassium: 3.2 mmol/L — ABNORMAL LOW (ref 3.5–5.1)
Sodium: 139 mmol/L (ref 135–145)
Total Bilirubin: 0.8 mg/dL (ref 0.3–1.2)
Total Protein: 6.6 g/dL (ref 6.5–8.1)

## 2016-06-19 LAB — I-STAT CHEM 8, ED
BUN: 21 mg/dL — ABNORMAL HIGH (ref 6–20)
Calcium, Ion: 1.08 mmol/L — ABNORMAL LOW (ref 1.15–1.40)
Chloride: 106 mmol/L (ref 101–111)
Creatinine, Ser: 1.1 mg/dL (ref 0.61–1.24)
Glucose, Bld: 194 mg/dL — ABNORMAL HIGH (ref 65–99)
HCT: 46 % (ref 39.0–52.0)
Hemoglobin: 15.6 g/dL (ref 13.0–17.0)
Potassium: 3.2 mmol/L — ABNORMAL LOW (ref 3.5–5.1)
Sodium: 143 mmol/L (ref 135–145)
TCO2: 21 mmol/L (ref 0–100)

## 2016-06-19 LAB — CBC
HCT: 45.2 % (ref 39.0–52.0)
Hemoglobin: 14.4 g/dL (ref 13.0–17.0)
MCH: 28.9 pg (ref 26.0–34.0)
MCHC: 31.9 g/dL (ref 30.0–36.0)
MCV: 90.6 fL (ref 78.0–100.0)
Platelets: 173 10*3/uL (ref 150–400)
RBC: 4.99 MIL/uL (ref 4.22–5.81)
RDW: 14.1 % (ref 11.5–15.5)
WBC: 10.1 10*3/uL (ref 4.0–10.5)

## 2016-06-19 LAB — I-STAT TROPONIN, ED: Troponin i, poc: 0.15 ng/mL (ref 0.00–0.08)

## 2016-06-19 LAB — DIFFERENTIAL
Basophils Absolute: 0 10*3/uL (ref 0.0–0.1)
Basophils Relative: 0 %
Eosinophils Absolute: 0.1 10*3/uL (ref 0.0–0.7)
Eosinophils Relative: 1 %
Lymphocytes Relative: 24 %
Lymphs Abs: 2.4 10*3/uL (ref 0.7–4.0)
Monocytes Absolute: 0.7 10*3/uL (ref 0.1–1.0)
Monocytes Relative: 7 %
Neutro Abs: 6.8 10*3/uL (ref 1.7–7.7)
Neutrophils Relative %: 68 %

## 2016-06-19 LAB — APTT: aPTT: 27 seconds (ref 24–36)

## 2016-06-19 SURGERY — THROMBECTOMY AND REVISION OF ARTERIOVENTOUS (AV) GORETEX  GRAFT
Anesthesia: General | Site: Leg Lower | Laterality: Right

## 2016-06-19 MED ORDER — DILTIAZEM HCL 100 MG IV SOLR
5.0000 mg/h | INTRAVENOUS | Status: DC
  Filled 2016-06-19: qty 100

## 2016-06-19 MED ORDER — GLYCOPYRROLATE 0.2 MG/ML IJ SOLN
INTRAMUSCULAR | Status: DC | PRN
  Administered 2016-06-19: 0.4 mg via INTRAVENOUS

## 2016-06-19 MED ORDER — ONDANSETRON HCL 4 MG/2ML IJ SOLN
INTRAMUSCULAR | Status: DC | PRN
  Administered 2016-06-19: 4 mg via INTRAVENOUS

## 2016-06-19 MED ORDER — LACTATED RINGERS IV SOLN
INTRAVENOUS | Status: DC
  Administered 2016-06-19 (×4): via INTRAVENOUS

## 2016-06-19 MED ORDER — GLYCOPYRROLATE 0.2 MG/ML IV SOSY
PREFILLED_SYRINGE | INTRAVENOUS | Status: AC
  Filled 2016-06-19: qty 3

## 2016-06-19 MED ORDER — SUFENTANIL CITRATE 50 MCG/ML IV SOLN
INTRAVENOUS | Status: DC | PRN
  Administered 2016-06-19 (×2): 10 ug via INTRAVENOUS
  Administered 2016-06-19: 5 ug via INTRAVENOUS
  Administered 2016-06-19: 10 ug via INTRAVENOUS

## 2016-06-19 MED ORDER — HEPARIN (PORCINE) IN NACL 100-0.45 UNIT/ML-% IJ SOLN
1400.0000 [IU]/h | INTRAMUSCULAR | Status: DC
  Administered 2016-06-19: 1400 [IU]/h via INTRAVENOUS
  Filled 2016-06-19 (×2): qty 250

## 2016-06-19 MED ORDER — IOPAMIDOL (ISOVUE-300) INJECTION 61%
INTRAVENOUS | Status: AC
  Filled 2016-06-19: qty 150

## 2016-06-19 MED ORDER — LIDOCAINE 2% (20 MG/ML) 5 ML SYRINGE
INTRAMUSCULAR | Status: AC
  Filled 2016-06-19: qty 5

## 2016-06-19 MED ORDER — FENTANYL CITRATE (PF) 100 MCG/2ML IJ SOLN
INTRAMUSCULAR | Status: AC
  Filled 2016-06-19: qty 4

## 2016-06-19 MED ORDER — HYDRALAZINE HCL 20 MG/ML IJ SOLN
INTRAMUSCULAR | Status: AC
  Filled 2016-06-19: qty 1

## 2016-06-19 MED ORDER — PROPOFOL 10 MG/ML IV BOLUS
INTRAVENOUS | Status: DC | PRN
  Administered 2016-06-19: 200 mg via INTRAVENOUS

## 2016-06-19 MED ORDER — FENTANYL CITRATE (PF) 100 MCG/2ML IJ SOLN
INTRAMUSCULAR | Status: AC
  Administered 2016-06-20: 100 ug
  Filled 2016-06-19: qty 2

## 2016-06-19 MED ORDER — LIDOCAINE HCL (CARDIAC) 20 MG/ML IV SOLN
INTRAVENOUS | Status: DC | PRN
  Administered 2016-06-19: 60 mg via INTRAVENOUS

## 2016-06-19 MED ORDER — IOPAMIDOL (ISOVUE-300) INJECTION 61%
INTRAVENOUS | Status: DC | PRN
  Administered 2016-06-19: 40 mL via INTRA_ARTERIAL

## 2016-06-19 MED ORDER — HEPARIN SODIUM (PORCINE) 1000 UNIT/ML IJ SOLN
INTRAMUSCULAR | Status: DC | PRN
  Administered 2016-06-19: 3000 [IU] via INTRAVENOUS
  Administered 2016-06-19: 7000 [IU] via INTRAVENOUS

## 2016-06-19 MED ORDER — 0.9 % SODIUM CHLORIDE (POUR BTL) OPTIME
TOPICAL | Status: DC | PRN
  Administered 2016-06-19: 2000 mL

## 2016-06-19 MED ORDER — NEOSTIGMINE METHYLSULFATE 5 MG/5ML IV SOSY
PREFILLED_SYRINGE | INTRAVENOUS | Status: AC
Start: 2016-06-19 — End: 2016-06-19
  Filled 2016-06-19: qty 5

## 2016-06-19 MED ORDER — SODIUM CHLORIDE 0.9 % IV SOLN
500.0000 mL | Freq: Once | INTRAVENOUS | Status: AC | PRN
  Administered 2016-06-20 (×2): via INTRAVENOUS

## 2016-06-19 MED ORDER — HYDRALAZINE HCL 20 MG/ML IJ SOLN
5.0000 mg | Freq: Once | INTRAMUSCULAR | Status: AC
  Administered 2016-06-19: 5 mg via INTRAVENOUS

## 2016-06-19 MED ORDER — PROPOFOL 10 MG/ML IV BOLUS
INTRAVENOUS | Status: AC
  Filled 2016-06-19: qty 20

## 2016-06-19 MED ORDER — LABETALOL HCL 5 MG/ML IV SOLN
10.0000 mg | Freq: Once | INTRAVENOUS | Status: AC
  Administered 2016-06-19: 10 mg via INTRAVENOUS

## 2016-06-19 MED ORDER — ROCURONIUM BROMIDE 100 MG/10ML IV SOLN
INTRAVENOUS | Status: DC | PRN
  Administered 2016-06-19: 40 mg via INTRAVENOUS
  Administered 2016-06-19: 60 mg via INTRAVENOUS
  Administered 2016-06-19: 10 mg via INTRAVENOUS
  Administered 2016-06-19: 20 mg via INTRAVENOUS
  Administered 2016-06-19: 10 mg via INTRAVENOUS

## 2016-06-19 MED ORDER — ROCURONIUM BROMIDE 10 MG/ML (PF) SYRINGE
PREFILLED_SYRINGE | INTRAVENOUS | Status: AC
  Filled 2016-06-19: qty 10

## 2016-06-19 MED ORDER — LABETALOL HCL 5 MG/ML IV SOLN
INTRAVENOUS | Status: AC
  Filled 2016-06-19: qty 4

## 2016-06-19 MED ORDER — SUFENTANIL CITRATE 50 MCG/ML IV SOLN
INTRAVENOUS | Status: AC
  Filled 2016-06-19: qty 1

## 2016-06-19 MED ORDER — SODIUM CHLORIDE 0.9 % IJ SOLN
INTRAMUSCULAR | Status: AC
  Filled 2016-06-19: qty 10

## 2016-06-19 MED ORDER — FENTANYL CITRATE (PF) 100 MCG/2ML IJ SOLN
25.0000 ug | INTRAMUSCULAR | Status: DC | PRN
  Administered 2016-06-19 – 2016-06-20 (×2): 50 ug via INTRAVENOUS

## 2016-06-19 MED ORDER — PHENYLEPHRINE HCL 10 MG/ML IJ SOLN
INTRAMUSCULAR | Status: DC | PRN
  Administered 2016-06-19 (×3): 80 ug via INTRAVENOUS

## 2016-06-19 MED ORDER — SODIUM CHLORIDE 0.9 % IV SOLN
1500.0000 mg | INTRAVENOUS | Status: AC
  Administered 2016-06-19: 1500 mg via INTRAVENOUS
  Filled 2016-06-19: qty 1500

## 2016-06-19 MED ORDER — SODIUM CHLORIDE 0.9 % IV SOLN
INTRAVENOUS | Status: DC
  Administered 2016-06-19 – 2016-06-20 (×2): via INTRAVENOUS

## 2016-06-19 MED ORDER — FENTANYL CITRATE (PF) 100 MCG/2ML IJ SOLN
INTRAMUSCULAR | Status: DC | PRN
  Administered 2016-06-19 (×2): 50 ug via INTRAVENOUS
  Administered 2016-06-19: 100 ug via INTRAVENOUS

## 2016-06-19 MED ORDER — SODIUM CHLORIDE 0.9 % IV SOLN
INTRAVENOUS | Status: DC | PRN
  Administered 2016-06-19: 500 mL

## 2016-06-19 MED ORDER — POTASSIUM CHLORIDE 10 MEQ/100ML IV SOLN: 10.0000 meq | INTRAVENOUS | Status: AC

## 2016-06-19 MED ORDER — LABETALOL HCL 5 MG/ML IV SOLN
INTRAVENOUS | Status: DC | PRN
  Administered 2016-06-19: 10 mg via INTRAVENOUS

## 2016-06-19 MED ORDER — ONDANSETRON HCL 4 MG/2ML IJ SOLN
INTRAMUSCULAR | Status: AC
  Filled 2016-06-19: qty 2

## 2016-06-19 MED ORDER — IOPAMIDOL (ISOVUE-370) INJECTION 76%
INTRAVENOUS | Status: AC
  Filled 2016-06-19: qty 100

## 2016-06-19 MED ORDER — PHENYLEPHRINE HCL 10 MG/ML IJ SOLN
INTRAVENOUS | Status: DC | PRN
  Administered 2016-06-19: 25 ug/min via INTRAVENOUS

## 2016-06-19 MED ORDER — NEOSTIGMINE METHYLSULFATE 10 MG/10ML IV SOLN
INTRAVENOUS | Status: DC | PRN
  Administered 2016-06-19: 3 mg via INTRAVENOUS

## 2016-06-19 MED ORDER — HEPARIN SODIUM (PORCINE) 5000 UNIT/ML IJ SOLN
60.0000 [IU]/kg | Freq: Once | INTRAMUSCULAR | Status: AC
  Administered 2016-06-19: 7500 [IU] via INTRAVENOUS
  Filled 2016-06-19: qty 2

## 2016-06-19 MED ORDER — IOPAMIDOL (ISOVUE-370) INJECTION 76%
INTRAVENOUS | Status: AC
  Administered 2016-06-19: 100 mL
  Filled 2016-06-19: qty 50

## 2016-06-19 SURGICAL SUPPLY — 57 items
ARMBAND PINK RESTRICT EXTREMIT (MISCELLANEOUS) ×6 IMPLANT
BAG ISOLATION DRAPE 18X18 (DRAPES) ×2 IMPLANT
BENZOIN TINCTURE PRP APPL 2/3 (GAUZE/BANDAGES/DRESSINGS) ×9 IMPLANT
CANISTER SUCTION 2500CC (MISCELLANEOUS) ×3 IMPLANT
CANNULA VESSEL 3MM 2 BLNT TIP (CANNULA) ×6 IMPLANT
CATH EMB 3FR 80CM (CATHETERS) ×3 IMPLANT
CATH EMB 4FR 80CM (CATHETERS) ×6 IMPLANT
CLIP LIGATING EXTRA MED SLVR (CLIP) ×3 IMPLANT
CLIP LIGATING EXTRA SM BLUE (MISCELLANEOUS) ×3 IMPLANT
CLSR STERI-STRIP ANTIMIC 1/2X4 (GAUZE/BANDAGES/DRESSINGS) ×6 IMPLANT
CONT SPEC 4OZ CLIKSEAL STRL BL (MISCELLANEOUS) ×3 IMPLANT
DECANTER SPIKE VIAL GLASS SM (MISCELLANEOUS) ×3 IMPLANT
DRAPE HALF SHEET 40X57 (DRAPES) ×9 IMPLANT
DRAPE IMP U-DRAPE 54X76 (DRAPES) ×3 IMPLANT
DRAPE ISOLATION BAG 18X18 (DRAPES) ×1
DRAPE ORTHO SPLIT 77X108 STRL (DRAPES) ×2
DRAPE SURG ORHT 6 SPLT 77X108 (DRAPES) ×4 IMPLANT
DRAPE X-RAY CASS 24X20 (DRAPES) ×6 IMPLANT
ELECT REM PT RETURN 9FT ADLT (ELECTROSURGICAL) ×3
ELECTRODE REM PT RTRN 9FT ADLT (ELECTROSURGICAL) ×2 IMPLANT
GAUZE SPONGE 4X4 12PLY STRL (GAUZE/BANDAGES/DRESSINGS) ×6 IMPLANT
GEL ULTRASOUND 20GR AQUASONIC (MISCELLANEOUS) IMPLANT
GLOVE BIO SURGEON STRL SZ 6.5 (GLOVE) ×3 IMPLANT
GLOVE BIOGEL PI IND STRL 6.5 (GLOVE) ×2 IMPLANT
GLOVE BIOGEL PI INDICATOR 6.5 (GLOVE) ×1
GLOVE SS BIOGEL STRL SZ 7.5 (GLOVE) ×2 IMPLANT
GLOVE SUPERSENSE BIOGEL SZ 7.5 (GLOVE) ×1
GOWN STRL REUS W/ TWL LRG LVL3 (GOWN DISPOSABLE) ×12 IMPLANT
GOWN STRL REUS W/TWL LRG LVL3 (GOWN DISPOSABLE) ×6
KIT BASIN OR (CUSTOM PROCEDURE TRAY) ×3 IMPLANT
KIT ROOM TURNOVER OR (KITS) ×3 IMPLANT
NS IRRIG 1000ML POUR BTL (IV SOLUTION) ×3 IMPLANT
PACK CV ACCESS (CUSTOM PROCEDURE TRAY) ×3 IMPLANT
PAD ARMBOARD 7.5X6 YLW CONV (MISCELLANEOUS) ×6 IMPLANT
PENCIL BUTTON HOLSTER BLD 10FT (ELECTRODE) ×3 IMPLANT
SET COLLECT BLD 21X3/4 12 PB (MISCELLANEOUS) ×3 IMPLANT
SPONGE GAUZE 4X4 12PLY STER LF (GAUZE/BANDAGES/DRESSINGS) ×9 IMPLANT
SPONGE LAP 18X18 X RAY DECT (DISPOSABLE) ×3 IMPLANT
STOCKINETTE IMPERVIOUS 9X36 MD (GAUZE/BANDAGES/DRESSINGS) ×3 IMPLANT
STOPCOCK 4 WAY LG BORE MALE ST (IV SETS) ×3 IMPLANT
STRIP CLOSURE SKIN 1/2X4 (GAUZE/BANDAGES/DRESSINGS) ×3 IMPLANT
SUT PROLENE 5 0 C 1 24 (SUTURE) ×15 IMPLANT
SUT PROLENE 6 0 CC (SUTURE) ×36 IMPLANT
SUT VIC AB 2-0 CT1 27 (SUTURE) ×3
SUT VIC AB 2-0 CT1 TAPERPNT 27 (SUTURE) ×6 IMPLANT
SUT VIC AB 3-0 SH 27 (SUTURE) ×4
SUT VIC AB 3-0 SH 27X BRD (SUTURE) ×8 IMPLANT
SYR 20CC LL (SYRINGE) ×3 IMPLANT
SYR 30ML LL (SYRINGE) ×3 IMPLANT
SYR 3ML LL SCALE MARK (SYRINGE) ×6 IMPLANT
SYR TB 1ML LUER SLIP (SYRINGE) ×3 IMPLANT
TAPE CLOTH SURG 4X10 WHT LF (GAUZE/BANDAGES/DRESSINGS) ×9 IMPLANT
TOWEL OR 17X26 10 PK STRL BLUE (TOWEL DISPOSABLE) ×3 IMPLANT
TUBE CONNECTING 12X1/4 (SUCTIONS) ×3 IMPLANT
TUBING EXTENTION W/L.L. (IV SETS) ×3 IMPLANT
UNDERPAD 30X30 (UNDERPADS AND DIAPERS) ×3 IMPLANT
WATER STERILE IRR 1000ML POUR (IV SOLUTION) ×3 IMPLANT

## 2016-06-19 NOTE — ED Notes (Signed)
Pulses not palpable in R foot and R wrist. Found with doppler.

## 2016-06-19 NOTE — Progress Notes (Signed)
ANTICOAGULATION CONSULT NOTE - Initial Consult  Pharmacy Consult:  Heparin Indication:  Aortic thrombus  Allergies  Allergen Reactions  . Penicillins Rash    Patient Measurements: Height: 5' 8.9" (175 cm) Weight: 275 lb (124.7 kg) IBW/kg (Calculated) : 70.47 Heparin Dosing Weight: 97 kg  Vital Signs: Temp: 97.9 F (36.6 C) (09/09 1207) Temp Source: Oral (09/09 1207) BP: 196/121 (09/09 1345) Pulse Rate: 98 (09/09 1345)  Labs:  Recent Labs  06/14/2016 1222 06/20/2016 1230  HGB 14.4 15.6  HCT 45.2 46.0  PLT 173  --   APTT 27  --   LABPROT 14.8  --   INR 1.15  --   CREATININE 1.11 1.10    Estimated Creatinine Clearance: 74.5 mL/min (by C-G formula based on SCr of 1.1 mg/dL).   Medical History: Past Medical History:  Diagnosis Date  . Hypertension    not on any antihypertensives      Assessment: [age] YOM presented with right leg weakness and numbness.  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  MRI showed a new stroke.  Pharmacy consulted to initiate IV heparin for VTE treatment.  Patient denies taking blood thinners.  Baseline labs reviewed.   Goal of Therapy:  Heparin level 0.3 - 0.5 units/ml Monitor platelets by anticoagulation protocol: Yes     Plan:  - Heparin gtt at 1400 units/hr, no bolus due to new CVA - Check 8 hr heparin level - Daily heparin level and CBC   Dallas Scorsone D. Laney Potashang, PharmD, BCPS Pager:  [phone] 07/09/2016, 2:01 PM

## 2016-06-19 NOTE — ED Notes (Signed)
Pt transported to CT with this nurse °

## 2016-06-19 NOTE — ED Provider Notes (Signed)
[facility]-EMERGENCY DEPT Provider Note   CSN: [id number] Arrival date & time: 07/05/2016  1157    By signing my name below, I, Sonum Patel, attest that this documentation has been prepared under the direction and in the presence of Raeford RazorStephen Wymon Swaney, MD. Electronically Signed: Sonum Patel, Neurosurgeoncribe. 06/28/2016. 12:22 PM.  History   Chief Complaint Chief Complaint  Patient presents with  . Numbness    The history is provided by the patient. No language interpreter was used.     HPI Comments: Luis Sellers is a 75 y.o. male with past medical history of HTN, brought in by ambulance, who presents to the Emergency Department complaining of sudden onset, constant, unchanged right lower extremity numbness and weakness below the knee that began about 45 min ago. Patient states he is unable to feel anything below his right knee. He denies falling today. He also complains of right hip pain that began earlier today. He states the RLE has given him trouble for many years. He uses a cane at baseline. Per nurse who spoke with EMS, patient's blood pressure on left was 200's systolic and on right was 100's systolic. He denies chest pain, back pain, right foot pain.    Past Medical History:  Diagnosis Date  . Hypertension    not on any antihypertensives    There are no active problems to display for this patient.   History reviewed. No pertinent surgical history.     Home Medications    Prior to Admission medications   Medication Sig Start Date End Date Taking? Authorizing Provider  benazepril-hydrochlorthiazide (LOTENSIN HCT) 20-25 MG per tablet Take 1 tablet by mouth daily. 08/06/11   Denna HaggardNodira J Karimova, MD  omeprazole (PRILOSEC) 20 MG capsule Take 20 mg by mouth daily.      Historical Provider, MD    Family History No family history on file.  Social History Social History  Substance Use Topics  . Smoking status: Former Smoker    Types: Cigarettes  . Smokeless tobacco: Not on file  . Alcohol  use Not on file     Allergies   Penicillins   Review of Systems Review of Systems  Cardiovascular: Negative for chest pain.  Musculoskeletal: Positive for arthralgias (right hip pain). Negative for back pain.  Neurological: Positive for weakness (RLE) and numbness (RLE).  All other systems reviewed and are negative.    Physical Exam Updated Vital Signs BP (!) 215/124 (BP Location: Left Arm)   Temp 97.9 F (36.6 C) (Oral)   Resp 18   SpO2 97%   Physical Exam  Constitutional: He is oriented to person, place, and time. He appears well-developed and well-nourished.  HENT:  Head: Normocephalic and atraumatic.  Eyes: EOM are normal. Pupils are equal, round, and reactive to light.  Neck: Normal range of motion.  Cardiovascular: Normal rate and normal heart sounds.  An irregularly irregular rhythm present.  Heart irregularly irregular. Dopplerable right radial pulse. Easily palpable left radial pulse. Non palpable pulses to right femoral and right DP. Dopplerable, but nonpalpable L femoral. Could not palpate or doppler L DP.  Pulmonary/Chest: Effort normal and breath sounds normal. No respiratory distress.  Abdominal: Soft. He exhibits no distension. There is no tenderness.  Neurological: He is alert and oriented to person, place, and time. No cranial nerve deficit.  RLE: Muscle contractions on the proximal right leg. No sensation to painful stimuli up to level of knee. Decreased sensation from knee to mid thigh. Normal sensation proximally. Mild R pronator  drift.   Skin: Skin is warm and dry.  Psychiatric: He has a normal mood and affect. Judgment normal.  Nursing note and vitals reviewed.    ED Treatments / Results  DIAGNOSTIC STUDIES: Oxygen Saturation is 97% on RA, adequate by my interpretation.    COORDINATION OF CARE: 12:32 PM Discussed treatment plan with pt at bedside and pt agreed to plan.  1:41 PM Updated patient on imaging results. Discussed with patient that Dr.  Arbie Cookey will see him need for intervention. Patient understands and is agreeable to plan.     Labs (all labs ordered are listed, but only abnormal results are displayed) Labs Reviewed  COMPREHENSIVE METABOLIC PANEL - Abnormal; Notable for the following:       Result Value   Potassium 3.2 (*)    CO2 19 (*)    Glucose, Bld 195 (*)    ALT 14 (*)    All other components within normal limits  I-STAT TROPOININ, ED - Abnormal; Notable for the following:    Troponin i, poc 0.15 (*)    All other components within normal limits  I-STAT CHEM 8, ED - Abnormal; Notable for the following:    Potassium 3.2 (*)    BUN 21 (*)    Glucose, Bld 194 (*)    Calcium, Ion 1.08 (*)    All other components within normal limits  PROTIME-INR  APTT  CBC  DIFFERENTIAL  HEPARIN LEVEL (UNFRACTIONATED)  CBG MONITORING, ED    EKG  EKG Interpretation None       Radiology Ct Angio Chest/abd/pel For Dissection W And/or Wo Contrast  Result Date: 06/15/2016 CLINICAL DATA:  Presented with code stroke. Hypertension. Right hip pain and leg weakness. Recent diagnosis of atrial fibrillation. EXAM: CT ANGIOGRAPHY CHEST, ABDOMEN AND PELVIS TECHNIQUE: Multidetector CT imaging through the chest, abdomen and pelvis was performed using the standard protocol during bolus administration of intravenous contrast. Multiplanar reconstructed images and MIPs were obtained and reviewed to evaluate the vascular anatomy. CONTRAST:  100 cc Isovue 370 COMPARISON:  None. FINDINGS: Vascular Findings of the chest: Cardiomegaly. Minimal coronary artery calcifications. Calcifications within the aortic valve leaflets. No pericardial effusion though a small amount of fluid is seen within the pericardial recess. No discrete filling defects are seen within the left ventricle or atrium on this nongated examination. There is mild fusiform aneurysmal dilatation of the ascending thoracic aorta with the ascending thoracic aorta measuring approximately 40  mm in diameter. The thoracic aorta tapers to a normal caliber at the level of the aortic arch. Scattered minimal amount of atherosclerotic plaque within a normal caliber thoracic aorta, not resulting in a hemodynamically significant stenosis. Review of the precontrast images are negative for the presence of an intramural hematoma. No definite thoracic aortic dissection on this nongated examination. Bovine configuration of the aortic arch. There is non opacification of right subclavian artery (coronal image 100, series 8), just peripheral to the takeoff of the still patent right IMA (image 23, series 5). The remaining branch vessels of the aortic arch are tortuous though patent throughout their imaged course. Although this examination was not tailored for the evaluation the pulmonary arteries, there are no discrete filling defects within the central pulmonary arterial tree to suggest central pulmonary embolism. Enlarged caliber of the main pulmonary artery measuring 35 mm in diameter. ------------------------------------------------------------- Thoracic aortic measurements: Sinotubular junction 34 mm as measured in greatest oblique coronal dimension. Proximal ascending aorta 40 mm as measured in greatest oblique axial dimension at the level  of the main pulmonary artery (image 57, series 5) an approximately 40 mm in greatest oblique coronal diameter (coronal image 74, series 8). Aortic arch aorta 33 mm as measured in greatest oblique sagittal dimension. Proximal descending thoracic aorta 34 mm as measured in greatest oblique axial dimension at the level of the main pulmonary artery. Distal descending thoracic aorta 31 mm as measured in greatest oblique axial dimension at the level of the diaphragmatic hiatus. Review of the MIP images confirms the above findings. ------------------------------------------------------------- Non-Vascular Findings of the chest: Evaluation the pulmonary parenchyma is degraded secondary  to patient respiratory artifact. Minimal dependent subpleural ground-glass atelectasis. Mild-to-moderate peripheral and apical predominant centrilobular emphysematous change. Mild diffuse bronchial wall thickening. The central airways remain patent. No discrete focal airspace opacities. No pleural effusion or pneumothorax. No discrete pulmonary nodules given limitation of the examination. Solitary enlarged precarinal lymph node measures 1.5 cm in greatest short axis diameter (image 47, series 5 and is presumably reactive in etiology. Additional scattered mediastinal lymph nodes are numerous and prominent though individually not enlarged by size criteria with index prevascular lymph node measuring 0.9 cm in diameter (is 45, series 5). No mediastinal, hilar axillary lymphadenopathy. Regional soft tissues appear normal. No acute or aggressive osseous abnormalities within the chest. Stigmata of DISH within the thoracic spine. --------------------------------------------------------------- Vascular Findings of the abdomen and pelvis: Abdominal aorta: There is a moderate to large amount of mixed calcified and noncalcified atherosclerotic plaque within a normal caliber abdominal aorta. There is age indeterminate hypo attenuating thrombus within the aortic bifurcation which occludes the left common iliac artery and results in a focal severe (greater than 90%) luminal narrowing involving the right common iliac artery (representative axial images 21 241, series 5, coronal image 70, series 8). No abdominal aortic dissection or periapical vascular stranding. Celiac artery: There is a minimal amount of eccentric mixed calcified and noncalcified atherosclerotic plaque involving the origin the celiac artery, not resulting in hemodynamically significant stenosis. Conventional branching pattern. The distal tributaries of the celiac artery appear patent without discrete intraluminal filling defect. SMA: There is a minimal amount of  mixed calcified and noncalcified atherosclerotic plaque involving the origin proximal aspect of the SMA, not resulting in hemodynamically significant stenosis. Conventional branching pattern. The distal tributaries the SMA are widely patent without discrete intraluminal filling defect suggest distal embolism. Right Renal artery: Solitary; there is a minimal amount of eccentric mixed calcified and noncalcified atherosclerotic plaque within in the right renal artery, not resulting in hemodynamically significant stenosis. There is a discrete filling defect within a subsegmental cranial division of the right renal artery with associated geographic oligemia involving in the anterior superior aspect of the right kidney (coronal image 88, series 8). Left Renal artery: Solitary; widely patent without hemodynamically significant narrowing. No evidence of distal embolism. IMA: Age-indeterminate occlusion involving the origin and proximal aspect of the IMA with early reconstitution via collateral supply from the SMA. Right-sided pelvic vasculature: As above, noncalcified thrombus extends from the caudal aspect of the abdominal aorta to result in a focal severe greater than 90%) luminal narrowing involving the origin of the right common iliac artery. The right internal iliac artery is thrombosed at its origin. There is complete occlusion involving the distal aspect of the right external iliac artery (image 273, series 5) extending to involve the imaged course of the right common, deep and superficial femoral arteries. Left-sided pelvic vasculature: As above, mural thrombus extends from the aortic bifurcation to occlude the left common and internal iliac arteries.  The left external iliac artery is occluded centrally with reconstitution via collateral supply from both the left inferior epigastric and deep circumflex iliac arteries. The left common, superficial and deep femoral arteries appear patent throughout their imaged  course. Review of the MIP images confirms the above findings. -------------------------------------------------------------------------------- Nonvascular Findings of the abdomen and pelvis: Evaluation of the abdominal organs is limited to the arterial phase of enhancement. 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 No discrete hyper enhancing hepatic lesions. Normal early arterial phase appearance of the gallbladder given underdistention. No radiopaque gallstones. No ascites. Normal early arterial phase appearance of the pancreas and spleen. No evidence of splenic infarct. There is geographic oligemia involving the anterior superior aspect of the right kidney worrisome for involving infarct (representative axial image 158, series 5, coronal image 83, series 8). Note is made of an approximately 1.1 cm hypo attenuating (6 Hounsfield unit) right-sided renal cyst. No discrete left-sided renal lesions. No urinary obstruction. There is a minimal amount of asymmetric right-sided perinephric stranding. There is mild diffuse thickening of the bilateral adrenal glands without discrete nodule. Normal appearance of the urinary bladder given degree distention. Moderate colonic stool burden without evidence of enteric obstruction. Scatter minimal colonic diverticulosis without evidence of diverticulitis. The bowel is otherwise normal in course and caliber without wall thickening or evidence of enteric obstruction. Normal appearance of the terminal ileum and appendix. No discrete areas of bowel wall thickening. No pneumoperitoneum, pneumatosis or portal venous gas. No bulky retroperitoneal, mesenteric, pelvic or inguinal lymphadenopathy. Borderline prostatomegaly.  No free fluid in the pelvic cul-de-sac. No acute or aggressive osseous abnormalities. Mild-to-moderate multilevel lumbar spine DDD, worse at L4-L5 with disc space height loss, endplate irregularity and sclerosis. Mild degenerate change of the bilateral  hips, right greater than left. Note is made of a large (approximately 5.5 x 6.1 cm right-sided hydrocele. Small left-sided mesenteric fat containing indirect inguinal hernia. IMPRESSION: Vascular Impression of the chest: 1. 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 The additional branch vessels of the aortic arch are tortuous though patent throughout their imaged course. 2. 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 Recommend annual imaging followup by CTA or MRA. This recommendation follows 2010 ACCF/AHA/AATS/ACR/ASA/SCA/SCAI/SIR/STS/SVM Guidelines for the Diagnosis and Management of Patients with Thoracic Aortic Disease. Circulation. 2010; 121: Z610-R604. No evidence of thoracic aortic dissection. 3. Cardiomegaly with calcifications within the aortic valve leaflets, nonspecific though could be seen in the setting of aortic stenosis. Enlarged caliber the main pulmonary artery, nonspecific though could be seen in the setting of pulmonary arterial hypertension. Further evaluation cardiac echo could performed as clinically indicated. 4. Atherosclerosis including coronary artery calcifications. Aortic Atherosclerosis (ICD10-170.0) Nonvascular Impression of the chest: 1. Mild apical and peripheral/subpleural predominant centrilobular emphysematous change. Emphysema. (ICD10-J43.9) 2. Suspected superimposed airways disease/bronchitis. No focal airspace opacities to suggest pneumonia. _________________________________________________ Vascular Impression of the abdomen and pelvis: 1. Presumably acute intraluminal thrombus within the aortic bifurcation extending to occlude the left common iliac artery and result in a focal severe (> 90%) luminal narrowing of the right common iliac artery. No evidence of abdominal aortic aneurysm or dissection. Given history of recent diagnosis of atrial  fibrillation, this thrombus may be of cardiogenic origin. Further evaluation with cardiac echo is recommended. 2. The right common, superficial and deep femoral arteries are occluded throughout their course. Additionally, the right internal iliac artery is occluded. 3. 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 There is reconstitution of the distal aspect of the left external iliac artery via supply from the ipsilateral left inferior epigastric and deep iliac circumflex arteries. The left common, superficial and deep femoral arteries appear patent. 4. Occlusion of a subsegmental branch of the cranial division of the right renal artery with associated evolving infarct involving the anterior/superior aspect the right kidney. 5. Age-indeterminate occlusion involving the origin and proximal aspect of the IMA with early reconstitution via collateral supply from the SMA. Nonvascular Impression of the abdomen and pelvis: 1. No evidence of mesenteric ischemia. Specifically, no discrete areas of bowel wall thickening, pneumatosis or portal venous gas. Critical Value/emergent results were called by telephone at the time of interpretation on 06/29/2016 at 1:28 pm to Dr. Raeford Razor , who verbally acknowledged these results. Electronically Signed   By: Simonne Come M.D.   On: 06/23/2016 13:37   Ct Head Code Stroke W/o Cm  Result Date: 06/16/2016 CLINICAL DATA:  Code stroke. Sudden onset right lower extremity weakness and numbness below the knee beginning at 11:30 a.m. today. EXAM: CT HEAD WITHOUT CONTRAST TECHNIQUE: Contiguous axial images were obtained from the base of the skull through the vertex without intravenous contrast. COMPARISON:  None. FINDINGS: Moderate periventricular and subcortical white matter hypoattenuation is present bilaterally. This is symmetric. The basal ganglia are intact. Insular ribbon is normal bilaterally. No acute or focal cortical abnormality is present. ACA territories are intact. No  acute hemorrhage or mass lesion is present. The ventricles are of normal size. A remote lacunar infarct is present in the right cerebellum. The brainstem and cerebellum are otherwise unremarkable. Mild mucosal thickening is present in the frontal sinuses and anterior ethmoid air cells. The remaining paranasal sinuses and the mastoid air cells are clear. The calvarium is intact. No significant extracranial soft tissue lesion is present. ASPECTS [facility] Stroke Program Early CT Score) - Ganglionic level infarction (caudate, lentiform nuclei, internal capsule, insula, M1-M3 cortex): 7/7 - Supraganglionic infarction (M4-M6 cortex): 3/3 Total score (0-10 with 10 being normal): 10/10 IMPRESSION: 1. No acute intracranial abnormality. 2. Moderate diffuse white matter disease likely reflects the sequela of chronic microvascular ischemia. 3. 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 4. ASPECTS is 10/10 These results were called by telephone at the time of interpretation on 07/10/2016 at 12:47 pm to Dr. Raeford Razor , who verbally acknowledged these results. Electronically Signed   By: Marin Roberts M.D.   On: 07/07/2016 12:51    Procedures Procedures (including critical care time)  Medications Ordered in ED Medications  iopamidol (ISOVUE-370) 76 % injection (not administered)  labetalol (NORMODYNE,TRANDATE) 5 MG/ML injection (not administered)  heparin ADULT infusion 100 units/mL (25000 units/2103mL sodium chloride 0.45%) (not administered)  iopamidol (ISOVUE-370) 76 % injection (100 mLs  Contrast Given 06/13/2016 1230)  labetalol (NORMODYNE,TRANDATE) injection 10 mg (10 mg Intravenous Given 07/09/2016 1259)  heparin injection 7,500 Units (7,500 Units Intravenous Given 06/27/2016 1353)     Initial Impression / Assessment and Plan / ED Course  I have reviewed the triage vital signs and the nursing notes.  Pertinent labs & imaging results that were available during my care of the patient were reviewed by me  and considered in my medical decision making (see chart for details).  Clinical Course    75yM with RLE weakness. Noted to have new onset afib. Concern for CVA but confounding picture is significant pressure differential in upper extremities. Very hypertensive in LUE, but only able to doppler a  radial pulse on R and hard to even get a manual pressure. I cannot doppler R femoral pulse, only doppler L femoral pulse and cannot doppler pulse in either foot. He consistently denies pain which seems unusual for an acute arterial occlusion to the point that it is causing motor and sensory findings. He was made a code stroke, but also did CTa for possible dissection with vascular and neurologic findings on exam. Not typical, but small percentage of aortic dissections present w/o pain.   No dissection, but large amount of arterial thrombus in multiple locations.  Both neurology and vascular surgery have consulted on patient. Current plan is to emergently take to OR with vascular surgery. Heparin. Not TPA candidate because of heparin. Will need cardiology/medical work-up of new onset afib and other medical issues.   Final Clinical Impressions(s) / ED Diagnoses   Final diagnoses:  Arterial thrombosis (HCC)  New onset atrial fibrillation (HCC)    New Prescriptions New Prescriptions   No medications on file   I personally preformed the services scribed in my presence. The recorded information has been reviewed is accurate. Raeford Razor, MD.    Raeford Razor, MD Jul 10, 2016 [phone]

## 2016-06-19 NOTE — ED Notes (Signed)
Dr. Juleen ChinaKohut at bedside to rule out stroke.

## 2016-06-19 NOTE — ED Triage Notes (Signed)
Per [facility], pt from home, was walking around the house around 1130, states he had weakness in his R leg, with numbness. Per ems pt cannot feel below his R knee. Decreased pulses to R arm. Pt HR EKG ems afib without history. Pt htn and not been taking medicine for months. Speech clear, AAOx4.

## 2016-06-19 NOTE — Consult Note (Signed)
Neurology Consultation  CC: right leg weakness  History is obtained from:patient  HPI: Elijio MilesJohn Whetstone is a 75 y.o. male with a history of hypertension who presents with acute right leg weakness that started around 10:30 am. He states that he got up and noticed that he wasn't able to move the leg very well.   He was brought to the ER where it was noticed that he had markedly discordant pulses in the right arm and leg compared to the left. He denies pain.   He was taken for emergent CTA and then MRI brain. The MRI shows acute cerebellar infarcts, but I do not think that these are responsible for his symptoms. CTA shows occluded right femoral and internal iliac vessels   LKW: 10:30 am.  tpa given?: no, mild symptoms(of stroke)    ROS: A 14 point ROS was performed and is negative except as noted in the HPI.   Past Medical History:  Diagnosis Date  . Hypertension    not on any antihypertensives     FHx: No hx stroke  Social History:  reports that he has quit smoking. His smoking use included Cigarettes. He does not have any smokeless tobacco history on file. His alcohol and drug histories are not on file.   Exam: Current vital signs: BP (!) 231/122   Pulse 117   Temp 97.9 F (36.6 C) (Oral)   Resp 19   Ht 5' 8.9" (1.75 m)   Wt 124.7 kg (275 lb)   SpO2 93%   BMI 40.73 kg/m  Vital signs in last 24 hours: Temp:  [97.9 F (36.6 C)] 97.9 F (36.6 C) (09/09 1207) Pulse Rate:  [62-117] 117 (09/09 1230) Resp:  [18-24] 19 (09/09 1230) BP: (75-231)/(62-124) 231/122 (09/09 1230) SpO2:  [93 %-97 %] 93 % (09/09 1230) Weight:  [124.7 kg (275 lb)] 124.7 kg (275 lb) (09/09 1229)   Physical Exam  Constitutional: Appears well-developed and well-nourished.  Psych: Affect appropriate to situation Eyes: No scleral injection HENT: No OP obstrucion Head: Normocephalic.  Cardiovascular: Normal rate and regular rhythm.  Respiratory: Effort normal and breath sounds normal to anterior  ascultation GI: Soft.  No distension. There is no tenderness.  Skin: WDI  Neuro: Mental Status: Patient is awake, alert, oriented to person, place, month, year, and situation. Patient is able to give a clear and coherent history. No signs of aphasia or neglect Cranial Nerves: II: Visual Fields are full. Pupils are equal, round, and reactive to light.   III,IV, VI: EOMI without ptosis or diploplia.  V: Facial sensation is symmetric to temperature VII: Facial movement is symmetric.  VIII: hearing is intact to voice X: Uvula elevates symmetrically XI: Shoulder shrug is symmetric. XII: tongue is midline without atrophy or fasciculations.  Motor: Tone is normal. Bulk is normal. 5/5 strength was present in bilateral arms and left leg. He has preserved abduction of the right leg, 4/5 right knee extension, 0/5 ankle dorsi/plantar flexion.  Sensory: Sensation is decreased throughout the right leg("feels like there is water in it") Deep Tendon Reflexes: 2+ and symmetric in the biceps and patellae. Absent at ankles bilaterally Cerebellar: FNF intact bilaterally    I have reviewed labs in epic and the results pertinent to this consultation are: Cr 1.11  I have reviewed the images obtained: MRI brain - cerebellar infarcts  Impression: 75 year old male presenting with right lower extremity weakness. Given the concern for asymmetric pulses, he was taken for a CT angiogram which showed aortic  Thrombus  with occluded femoral arteries and occluded internal iliac on the right. His MRI does show some embolic phenomena, but I suspect that the small cerebellar infarcts are asymptomatic and not related to his current condition.  In this setting, I do not favor giving IV TPA for his cerebral ischemia as I suspect that these lesions are either asymptomatic or mildly symptomatically not responsible for his leg weakness.  Vascular surgery has been further consult at for his vascular  occlusion.  Recommendations: 1. HgbA1c, fasting lipid panel 2. Echocardiogram 3. Though there is some risk with anticoagulation in the setting of acute stroke, I think the risk is very small and the small size of his infarcts, and suspect that he will need IV heparin for his aortic thrombus and I feel that this is indicated. 4. Telemetry monitoring 5. Treatment of atrial fibrillation per primary team 6. PT consult, OT consult, Speech consult 7. please page stroke NP  Or  PA  Or MD   8am -4 pm starting 9/10 as this patient will be followed by the stroke team at this point.   You can look them up on [email or web address]    Ritta Slot, MD [facility] [phone]  If 7pm- 7am, please page neurology on call as listed in AMION.

## 2016-06-19 NOTE — Anesthesia Preprocedure Evaluation (Addendum)
Anesthesia Evaluation  Patient identified by MRN, date of birth, ID band Patient awake    Reviewed: Allergy & Precautions, H&P , NPO status , Patient's Chart, lab work & pertinent test results  Airway Mallampati: III  TM Distance: >3 FB Neck ROM: Full    Dental no notable dental hx. (+) Partial Lower, Partial Upper, Poor Dentition, Dental Advisory Given   Pulmonary neg pulmonary ROS, former smoker,    Pulmonary exam normal breath sounds clear to auscultation       Cardiovascular hypertension, Pt. on medications Atrial Fibrillation  Rhythm:Irregular Rate:Tachycardia     Neuro/Psych negative neurological ROS  negative psych ROS   GI/Hepatic negative GI ROS, Neg liver ROS,   Endo/Other  Morbid obesity  Renal/GU negative Renal ROS  negative genitourinary   Musculoskeletal   Abdominal   Peds  Hematology negative hematology ROS (+)   Anesthesia Other Findings   Reproductive/Obstetrics negative OB ROS                            Anesthesia Physical Anesthesia Plan  ASA: III and emergent  Anesthesia Plan: General   Post-op Pain Management:    Induction: Intravenous  Airway Management Planned: Oral ETT  Additional Equipment:   Intra-op Plan:   Post-operative Plan: Extubation in OR  Informed Consent: I have reviewed the patients History and Physical, chart, labs and discussed the procedure including the risks, benefits and alternatives for the proposed anesthesia with the patient or authorized representative who has indicated his/her understanding and acceptance.   Dental advisory given  Plan Discussed with: CRNA  Anesthesia Plan Comments:         Anesthesia Quick Evaluation

## 2016-06-19 NOTE — ED Notes (Signed)
Will defer swallow screen until pt is finished with surgery. Pt NPO at this time.

## 2016-06-19 NOTE — Transfer of Care (Signed)
Immediate Anesthesia Transfer of Care Note  Patient: Luis MilesJohn Sellers  Procedure(s) Performed: Procedure(s): THROMBECTOMY OF RIGHT ARM Brachial and subclavian thrombectomy.  Left  iliac  thrombectomy and  right iliac, Femoral and Popliteal  artery thrombectomy. (Bilateral) ANGIOGRAM lower EXTREMITY RIGHT X 2 (Right)  Patient Location: PACU  Anesthesia Type:General  Level of Consciousness: awake  Airway & Oxygen Therapy: Patient Spontanous Breathing and Patient connected to face mask oxygen  Post-op Assessment: Report given to RN and Post -op Vital signs reviewed and stable  Post vital signs: Reviewed and stable  Last Vitals:  Vitals:   07/02/2016 1415 06/25/2016 1430  BP: (!) 222/124 (!) 209/120  Pulse: 103 (!) 47  Resp: 22 20  Temp:      Last Pain:  Vitals:   06/22/2016 1315  TempSrc:   PainSc: 0-No pain         Complications: No apparent anesthesia complications

## 2016-06-19 NOTE — ED Notes (Signed)
Dr. Amada JupiterKirkpatrick and stroke nurse at bedside

## 2016-06-19 NOTE — ED Notes (Signed)
Per Dr. Amada JupiterKirkpatrick, can do 2H neuro checks

## 2016-06-19 NOTE — ED Notes (Signed)
Pt returned to room from MRI.

## 2016-06-19 NOTE — Anesthesia Procedure Notes (Signed)
Procedure Name: Intubation Date/Time: 06/22/2016 4:54 PM Performed by: Dairl PonderJIANG, Jaana Brodt Pre-anesthesia Checklist: Patient identified, Emergency Drugs available, Suction available, Patient being monitored and Timeout performed Patient Re-evaluated:Patient Re-evaluated prior to inductionOxygen Delivery Method: Circle system utilized Preoxygenation: Pre-oxygenation with 100% oxygen Intubation Type: IV induction Ventilation: Mask ventilation without difficulty, Oral airway inserted - appropriate to patient size and Two handed mask ventilation required Laryngoscope Size: Mac and 4 Grade View: Grade I Tube type: Oral Laser Tube: Cuffed inflated with minimal occlusive pressure - saline Tube size: 7.5 mm Number of attempts: 1 Airway Equipment and Method: Stylet Placement Confirmation: ETT inserted through vocal cords under direct vision,  positive ETCO2 and breath sounds checked- equal and bilateral Secured at: 23 cm Tube secured with: Tape Dental Injury: Teeth and Oropharynx as per pre-operative assessment

## 2016-06-19 NOTE — ED Notes (Signed)
Pt placed on 2L [place]

## 2016-06-19 NOTE — ED Notes (Signed)
Pt transported from CT to MRI ?

## 2016-06-19 NOTE — ED Notes (Signed)
Unable to hear manual BP on R arm.

## 2016-06-19 NOTE — Op Note (Signed)
OPERATIVE REPORT  DATE OF SURGERY: 06/30/2016  PATIENT: Luis Sellers, 75 y.o. male MRN: [id number]  DOB: 07/09/1941  PRE-OPERATIVE DIAGNOSIS: Ischemic right arm and right leg and left leg related to probable atrial embolus with the new diagnosis of atrial fibrillation  POST-OPERATIVE DIAGNOSIS:  Same  PROCEDURE: #1 right iliac, femoral, superficial femoral and deep femoral embolectomy followed by intraoperative arteriogram and right popliteal artery exploration and tibial embolectomy #2 left iliac embolectomy #3 right arm subclavian exploration and thromboendarterectomy of chronic clot exploration of brachial artery  SURGEON:  Gretta Began, M.D.  PHYSICIAN ASSISTANT: Samantha Rhyne PA-C  ANESTHESIA:  Gen.  EBL: 100 ml  Total I/O In: 1500 [I.V.:1500] Out: 230 [Urine:130; Blood:100]  BLOOD ADMINISTERED: None  DRAINS: None  SPECIMEN: Embolus from all these areas  COUNTS CORRECT:  YES  PLAN OF CARE: PACU stable   PATIENT DISPOSITION:  PACU - hemodynamically stable  PROCEDURE DETAILS: Patient was seen in the emergency department. Initially was thought to be a code stroke and then potentially a dissection. 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 Had clot in his right common superficial femoral and deep femoral artery. Had reconstitution with no clot in his left common femoral artery. The imaging did not go past the upper thigh. Also had the clot in the IMA with early reconstitution and in his right upper pole renal artery. He was taken to the operating room emergently for embolectomy. He had no motor and sensory function in his right foot. Had diminished motor and sensory function in his right arm and left foot.  She was placed in supine position and general anesthesia. Had the right axillary region subclavian region right and left groin and  right and left legs were prepped circumferentially with the foot in the bag. Attention was first turned to the right leg so this was most ischemic. I patient was obese. A transverse incision was made at the level of the inguinal crease and the subcutaneous fat was opened with electrocautery. The common artery was exposed at the level level of inguinal ligament. There was clot present in the artery. The superficial femoral and profundus femoris arteries were encircled with Vesseloops for control. The patient had been on heparin drip. Was given additional 7000 units intravenous heparin. The common femoral artery was opened transversely. A 4 Fogarty catheter was passed centrally and a large amount of chronic appearing clot was removed. There was excellent inflow and the artery was reoccluded proximally. 4 Fogarty was then passed distally and would pass only to the level of the mid thigh. Thrombus was removed from the superficial femoral artery. The Fogarty was then passed down the deep femoral artery and thrombus was removed from this as well with excellent backbleeding. Intraocular arteriogram was obtained through the superficial femoral artery and there was no flow seen at the popliteal level. For this reason the below-knee popliteal artery was opened with the medial approach. The gastrocnemius muscle was reflected superiorly. The popliteal artery was exposed. There was significant formation around this area. The popliteal artery was opened transversely. There was no clot in the artery. A Fogarty catheter was passed proximally and was able to be passed all the way to the groin. Thrombus was removed from superficial femoral artery and excellent flow was encountered. This was flushed with heparinized and reoccluded. 34 Fogarty catheter was passed distally. There was  some difficulty in negotiating this with some chronic occlusive disease. The anterior tibial vein was divided for better exposure. The refill BUN was  passed distally and no clot was removed distally. Intraoperative arteriogram was obtained. There was spasm but there was good flow in the peroneal artery down to the foot. The incision in the popliteal artery was closed with interrupted 6-0 Prolene sutures. Attention was then turned to the left groin. A transverse incision again was made the level of the inguinal crease and carried into the subcutaneous tissues with electrocautery. The common femoral artery was exposed and was encircled with a vessel loop. There will was a flaccid artery with no clot noted in the artery itself. The artery was controlled with Vesseloops. The artery was opened transversely and the Fogarty catheter was passed centrally. A great deal of clot was removed again from the iliac vessels with excellent inflow obtained after this was removed. This was reoccluded. Fogarty catheter was then passed all the way down to the ankle and no clot was retrieved. The transverse incision was closed with a running 5-0 Prolene suture. Clamps removed and excellent flow was encountered. This then noted that there was a diminished pulse in the right groin. This presumed that the great deal thrombus at the iliac and aorta was pushed over into the right system. Right artery was and reoccluded and the common femoral artery was reopened and again Fogarty cath was passed proximally showing a clot which was removed again giving good inflow. This artery was reclosed with a running 5-0 Prolene suture.  Finally attention was turned the arm. Incision was made at the antecubital space to just above the crease itself. The artery was extremely small. He was opened transversely and a 3 Fogarty was passed to the wrist with no clot distally. There was no clot in the artery itself. Fogarty catheter was passed centrally but not get would not go past the level of the axilla. For this reason a separate incision was made at the axilla. The artery was very small and inflamed at  this area. The artery was opened transversely as well and again 3 Fogarty was passed centrally but not would not travel past the level of the subclavian artery. For this reason the subclavian artery itself was exposed. This was done with the infraclavicular incision. The muscle bodies of the across major muscle were exposed. The subclavian artery was densely adherent with the fibrotic reaction. The vein was mobilized off the artery and tributary branches were divided for exposure. The artery was very thickened. The artery was opened transversely and there was a chronic occlusion of the artery. Endarterectomy of this chronic thrombus plug was used in addition to a Fogarty catheter passed centrally and this actually was able to be removed through the transverse arteriotomy with excellent inflow. The artery was reoccluded. Same tech was need used distally to remove the adherent chronic plug distally. This could be removed as well and the cardiac passed down the level of the axillary exposure. The 5-0 Prolene was used to close the transverse incision in the subclavian artery. The the axillary brachial artery were closed with interrupted 6-0 Prolene suture. Clamps removed and good pulse was noted at this level. The patient heparin was not reversed. The wounds were irrigated with saline and were closed with 2-0 Vicryl in the fascia and 3-0 Vicryl in subcuticular tissue. The popliteal incision on the right had extension of the fascial opening on the medial aspect distally towards the ankle. The  fascia was not closed but skin was closed in the right popliteal incision patient was transferred to the recovery room stable condition   Gretta Beganodd Early, M.D. 06/18/2016 10:37 PM

## 2016-06-19 NOTE — H&P (Signed)
Date: 07/03/2016               Patient Name:  Luis Sellers MRN: [id number]  DOB: [birth year] Age / Sex: 75 y.o., male   PCP: No Pcp Per Patient         Medical Service: Internal Medicine Teaching Service         Attending Physician: Dr. Raeford Razor, MD    First Contact: Dr. Noemi Chapel, DO Pager: [phone]  Second Contact: Dr. Gara Kroner, MD Pager: [phone]       After Hours (After 5p/  First Contact Pager: [phone]  weekends / holidays): Second Contact Pager: [phone]   Chief Complaint: leg weakness   History of Present Illness:  [name] is a very pleasant 75 y/o M with MHx significant for HTN who presents to the hospital for evaluation of lower extremity weakness. Pt reports this began this morning around 10 am when he was standing up to go to the bathroom. Reports he suddenly developed constant unchanged right lower extremity weakness and numbness which quickly spread to the left lower extremity. Pt subsequently fell, prompting him to go to the ED. He reports he is unable to feel anything bilaterally from the knees down. Denies any history of similar events. Denies any prior weakness. Patient reports he is usually pretty active however has been less active than he'd like to be secondary to left knee pain thought to be caused by arthritis. Denies any hx of travel, recent surgeries, history of blood clots, atrial fibrillation or bleeding disorders. Denies any family history of blood clots or bleeding disorders. Endorses some intentional weight loss. Patient admits to some occasional palpitations for the past several months. Denies any recent illness. Denies any fevers, chills, shortness of breath, chest pain, abdominal pain, blurred vision, headache or any other symptoms. Reports he has never had a colonoscopy.   In the ED the patient was found to be in Atrial Fibrillation. CT A chest/abd/pelvis showed no evidence of abdominal aortic dissection however did show extensive  vascular occlusion throughout. Intraluminal thrombus within the aortic bifurcation causing complete occlusion of the left common iliac artery, >90% occlusion of the right common iliac artery. Superficial and deep femoral arteries on the right are occluded throughout their course. Occlusion of a branch of the right renal artery causing evolving infarct of the anterior/superior aspect of right kidney. Occlusion of the origin and proximal aspect of IMA. CTA also showed occlusion of the right subclavian artery as well. 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 MRI brain also showed acute linear infarctions within the medial cerebellum bilaterally. Vascular surgery was consulted who is urgently taking the patient to the OR for 3 limb critical ischemia for thrombectomy and limb salvage.   Meds:  No outpatient prescriptions have been marked as taking for the 06/27/2016 encounter [facility]).    Allergies: Allergies as of 06/13/2016 - Review Complete 06/26/2016  Allergen Reaction Noted  . Penicillins Rash 08/06/2011   Past Medical History:  Diagnosis Date  . Hypertension    not on any antihypertensives    Family History:  Mother: throat cancer Sister: blood cancer  Social History:  Tobacco: remote history of tobacco abuse. 1 pk/day x 25 years. Quit 25 yrs ago Alcohol: denies Drug use: denies  Review of Systems: A complete ROS was negative except as per HPI.   Physical Exam: Blood pressure (!) 209/120, pulse (!) 47, temperature 97.9 F (36.6 C), temperature source  Oral, resp. rate 20, height 5' 8.9" (1.75 m), weight 275 lb (124.7 kg), SpO2 91 %. General: Alert, obese elderly african [place] male resting comfortably in bed. In no acute distress. On 2L O2 via [place] HENT: PERLL. EOMI. Facial features symmetric.  Cardiovascular, Irregularly irregular, rate approximately 110. No murmurs or rubs auscultated. Pulmonary: CTA BL. No wheezes or  crackles Abdomen: Obese abdomen. Non-distended and non-tender. No guarding or rigidity. Extremities BL LE significantly cold. Absent femoral and distal lower extremity pulses BL. Absent right radial pulse. Intact left radial pulse. UE warm to touch. 1+ edema of left LE and 2+ edema of RLE up to knee.  Decreased sensation of BL LE.   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 3 mm left peri-ophthalmic artery aneurysm.   Assessment & Plan by Problem: Active Problems:   Arterial thromboembolism (HCC)   Limb ischemia   Atrial fibrillation (HCC)   HTN (hypertension)  Critical Limb Ischemia Imaging demonstrates 3 limb critical ischemia. Started on Heparin ggt and to have urgent thrombectomy and limb salvage this afternoon. Vascular surgery on and we appreciate their advice in patients management.  Atrial Fibrillation Pt denies history of atrial fibrillation however does endorse a several month history of occasional palpitations.  -Patient on heparin currently. Will transition to oral anticoagulation post operation -Rate currently 113. 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 Neurology on board and we appreciate their advice regarding his management -Allowing permissive HTN to 220/110  Hypertension Patient reports he used to take medication for blood pressure however has been trying to control with diet recently. Blood pressures ~220/110. His current hypertension likely secondary to his acute process.  After surgery we are allowing permissive hypertension to 220/110 secondary to patients MRI seen on ischemia.  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 Denies any liver disease or alcohol use. -HepB surface and core antibody -HepA antibody -HepC antibody  Elevated Troponin Likely due to patients extensive ischemia however will trend troponins.  Initial troponin 0.15.  -Cardiology on  Hypokalemia Patient NPO.  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 Suggestion by radiology to follow up yearly with CT or MRI.   DVT Prophylaxis: Heparin ggt Diet: NPO until after procedure, then  HH diet Code Status: Full code IVF: NSL  Dispo: Admit patient to Inpatient with expected length of stay greater than 2 midnights.  SignedNoemi Chapel, DO 2016/06/24, 2:51 PM  Pager: [phone]

## 2016-06-19 NOTE — Consult Note (Signed)
Vascular and Vein Specialist of [facility]  Patient name: Luis Sellers MRN: [id number] DOB: [birth year] Sex: male  REASON FOR CONSULT: Acute embolus to right arm and both lower extremities  HPI: Luis Sellers is a 75 y.o. male, who is seen in the emergency department. He does very poor control of his health. He did not know that he was in atrial fibrillation. Reports that he fell this morning around 10 AM. Had weakness in his right arm and right leg was felt initially to have a potential stroke. Was found to have by CT angiogram occlusion of his right subclavian and both iliac vessels with reconstitution distally and the iliacs. Also has some thrombus in his renal artery and also inferior mesenteric artery. The physical exam he does have some weakness in his right arm. He is right-handed. He cannot feel her stress since his right foot at all and cannot stand or walk  Past Medical History:  Diagnosis Date  . Hypertension    not on any antihypertensives    No family history on file.  SOCIAL HISTORY: Social History   Social History  . Marital status: Single    Spouse name: N/A  . Number of children: N/A  . Years of education: N/A   Occupational History  . Not on file.   Social History Main Topics  . Smoking status: Former Smoker    Types: Cigarettes  . Smokeless tobacco: Not on file  . Alcohol use Not on file  . Drug use: Unknown  . Sexual activity: Not on file   Other Topics Concern  . Not on file   Social History Narrative  . No narrative on file    Allergies  Allergen Reactions  . Penicillins Rash    Current Facility-Administered Medications  Medication Dose Route Frequency Provider Last Rate Last Dose  . heparin ADULT infusion 100 units/mL (25000 units/233mL sodium chloride 0.45%)  1,400 Units/hr Intravenous Continuous Gerilyn Nestle, RPH      . iopamidol (ISOVUE-370) 76 % injection           . labetalol (NORMODYNE,TRANDATE) 5  MG/ML injection            Current Outpatient Prescriptions  Medication Sig Dispense Refill  . benazepril-hydrochlorthiazide (LOTENSIN HCT) 20-25 MG per tablet Take 1 tablet by mouth daily. 30 tablet 11  . omeprazole (PRILOSEC) 20 MG capsule Take 20 mg by mouth daily.        REVIEW OF SYSTEMS:  [X]  denotes positive finding, [ ]  denotes negative finding Cardiac  Comments:  Chest pain or chest pressure:    Shortness of breath upon exertion:    Short of breath when lying flat:    Irregular heart rhythm:        Vascular    Pain in calf, thigh, or hip brought on by ambulation:    Pain in feet at night that wakes you up from your sleep:     Blood clot in your veins:    Leg swelling:         Pulmonary    Oxygen at home:    Productive cough:     Wheezing:         Neurologic    Sudden weakness in arms or legs:     Sudden numbness in  arms or legs:     Sudden onset of difficulty speaking or slurred speech:    Temporary loss of vision in one eye:     Problems with dizziness:         Gastrointestinal    Blood in stool:     Vomited blood:         Genitourinary    Burning when urinating:     Blood in urine:        Psychiatric    Major depression:         Hematologic    Bleeding problems:    Problems with blood clotting too easily:        Skin    Rashes or ulcers:        Constitutional    Fever or chills:      PHYSICAL EXAM: Vitals:   02/12/2016 1327 02/12/2016 1330 02/12/2016 1345 02/12/2016 1400  BP: (!) 193/108 (!) 201/128 (!) 196/121 (!) 223/135  Pulse:  93 98 78  Resp: 25 21 19 21   Temp:      TempSrc:      SpO2:  (!) 85% 93% 93%  Weight:      Height:        GENERAL: The patient is a well-nourished male, in no acute distress. The vital signs are documented above. CARDIAC: Irregular rhythm, A. fib on monitor VASCULAR: 2+ left radial pulse. Absent right brachial and right radial and absent femoral pulses and distal pulses bilaterally PULMONARY: There is good air  exchange bilaterally without wheezing or rales. ABDOMEN: Soft and non-tender . No rebound or guarding. Denies any pain MUSCULOSKELETAL: There are no major deformities or cyanosis. NEUROLOGIC: Not feeling his right foot. Does have sensation of his left foot but diminished motor. Can squeeze with his right hand and feels this is diminished as well SKIN: There are no ulcers or rashes noted. PSYCHIATRIC: The patient has a normal affect.  DATA:  CT showing extensive thrombus in his right subclavian in both lower extremities  MEDICAL ISSUES: Critical limb ischemia of 3 extremities. Will take urgently to the operating room for thrombectomy and limb salvage. Will be admitted by the medicine service with cardiology consultation as well.   Larina Earthlyodd F. Kean Gautreau, MD FACS [facility] Tel [phone] Pager [phone]

## 2016-06-19 NOTE — Code Documentation (Addendum)
1250:  Arrived to assist with Code Stroke, Code stroke log completed, NIH done by primary ED RN, Neurologist present. Patient taken for emergent CTA and MRI. Patient's chief compliant of RLE weakness, patient has history of HTN (per patient "does not take any BP meds because they make feel bad").  Upon being assessed in the ED, patient has discordant pulses in the Rt ARM and Rt. LEG. Patient had emergent CTA and then MRI, MRI + acute cerebellar infarcts. CTA + for occlusion of right subclavian and both iliac vessels with reconstitution distally and the iliacs. Also has some thrombus in his renal artery and also inferior mesenteric artery. Patient was in AFIB, patient was not aware that he has AFIB.   NIH of 4, (RL = 3) and 1 (RL).  NO TPA per  Neuro MD but still code stroke. Patient will be seen by vascular.  @ 1400, per documentation, Vascular Surgery to take patient for emergent surgery for thrombectomy

## 2016-06-19 NOTE — ED Notes (Signed)
Early, MD at bedside.

## 2016-06-20 ENCOUNTER — Inpatient Hospital Stay (HOSPITAL_COMMUNITY): Payer: Medicare Other | Admitting: Anesthesiology

## 2016-06-20 ENCOUNTER — Inpatient Hospital Stay (HOSPITAL_COMMUNITY): Payer: Medicare Other

## 2016-06-20 ENCOUNTER — Encounter (HOSPITAL_COMMUNITY): Payer: Self-pay | Admitting: Pulmonary Disease

## 2016-06-20 ENCOUNTER — Encounter (HOSPITAL_COMMUNITY): Admission: EM | Disposition: E | Payer: Self-pay | Source: Home / Self Care | Attending: Vascular Surgery

## 2016-06-20 DIAGNOSIS — E785 Hyperlipidemia, unspecified: Secondary | ICD-10-CM

## 2016-06-20 DIAGNOSIS — I63113 Cerebral infarction due to embolism of bilateral vertebral arteries: Secondary | ICD-10-CM

## 2016-06-20 DIAGNOSIS — I4891 Unspecified atrial fibrillation: Secondary | ICD-10-CM

## 2016-06-20 DIAGNOSIS — I749 Embolism and thrombosis of unspecified artery: Secondary | ICD-10-CM

## 2016-06-20 DIAGNOSIS — M6282 Rhabdomyolysis: Secondary | ICD-10-CM

## 2016-06-20 DIAGNOSIS — I748 Embolism and thrombosis of other arteries: Secondary | ICD-10-CM

## 2016-06-20 DIAGNOSIS — R778 Other specified abnormalities of plasma proteins: Secondary | ICD-10-CM

## 2016-06-20 DIAGNOSIS — I745 Embolism and thrombosis of iliac artery: Secondary | ICD-10-CM

## 2016-06-20 DIAGNOSIS — E875 Hyperkalemia: Secondary | ICD-10-CM

## 2016-06-20 DIAGNOSIS — I6340 Cerebral infarction due to embolism of unspecified cerebral artery: Secondary | ICD-10-CM | POA: Insufficient documentation

## 2016-06-20 DIAGNOSIS — E872 Acidosis: Secondary | ICD-10-CM

## 2016-06-20 DIAGNOSIS — I10 Essential (primary) hypertension: Secondary | ICD-10-CM

## 2016-06-20 DIAGNOSIS — R7989 Other specified abnormal findings of blood chemistry: Secondary | ICD-10-CM

## 2016-06-20 DIAGNOSIS — N179 Acute kidney failure, unspecified: Secondary | ICD-10-CM

## 2016-06-20 DIAGNOSIS — I480 Paroxysmal atrial fibrillation: Secondary | ICD-10-CM

## 2016-06-20 HISTORY — PX: FEMORAL-POPLITEAL BYPASS GRAFT: SHX937

## 2016-06-20 HISTORY — PX: FASCIECTOMY: SHX6525

## 2016-06-20 LAB — BASIC METABOLIC PANEL
ANION GAP: 15 (ref 5–15)
ANION GAP: 14 (ref 5–15)
Anion gap: 15 (ref 5–15)
Anion gap: 11 (ref 5–15)
BUN: 21 mg/dL — AB (ref 6–20)
BUN: 31 mg/dL — AB (ref 6–20)
BUN: 31 mg/dL — ABNORMAL HIGH (ref 6–20)
BUN: 33 mg/dL — ABNORMAL HIGH (ref 6–20)
CALCIUM: 7.4 mg/dL — AB (ref 8.9–10.3)
CALCIUM: 6.7 mg/dL — AB (ref 8.9–10.3)
CHLORIDE: 105 mmol/L (ref 101–111)
CHLORIDE: 108 mmol/L (ref 101–111)
CHLORIDE: 109 mmol/L (ref 101–111)
CO2: 18 mmol/L — AB (ref 22–32)
CO2: 20 mmol/L — AB (ref 22–32)
CO2: 17 mmol/L — AB (ref 22–32)
CO2: 13 mmol/L — ABNORMAL LOW (ref 22–32)
CREATININE: 2.96 mg/dL — AB (ref 0.61–1.24)
CREATININE: 2.92 mg/dL — AB (ref 0.61–1.24)
CREATININE: 3 mg/dL — AB (ref 0.61–1.24)
Calcium: 7.9 mg/dL — ABNORMAL LOW (ref 8.9–10.3)
Calcium: 7.1 mg/dL — ABNORMAL LOW (ref 8.9–10.3)
Chloride: 106 mmol/L (ref 101–111)
Creatinine, Ser: 1.52 mg/dL — ABNORMAL HIGH (ref 0.61–1.24)
GFR calc Af Amer: 50 mL/min — ABNORMAL LOW (ref >60)
GFR calc Af Amer: 22 mL/min — ABNORMAL LOW (ref >60)
GFR calc non Af Amer: 43 mL/min — ABNORMAL LOW (ref >60)
GFR calc non Af Amer: 19 mL/min — ABNORMAL LOW (ref >60)
GFR calc non Af Amer: 19 mL/min — ABNORMAL LOW (ref >60)
GFR calc non Af Amer: 19 mL/min — ABNORMAL LOW (ref >60)
GFR, EST AFRICAN AMERICAN: 23 mL/min — AB (ref >60)
GFR, EST AFRICAN AMERICAN: 22 mL/min — AB (ref >60)
GLUCOSE: 147 mg/dL — AB (ref 65–99)
GLUCOSE: 95 mg/dL (ref 65–99)
Glucose, Bld: 59 mg/dL — ABNORMAL LOW (ref 65–99)
Glucose, Bld: 131 mg/dL — ABNORMAL HIGH (ref 65–99)
POTASSIUM: 7.3 mmol/L — AB (ref 3.5–5.1)
Potassium: 6.4 mmol/L (ref 3.5–5.1)
Potassium: 6.8 mmol/L (ref 3.5–5.1)
Potassium: 6.4 mmol/L (ref 3.5–5.1)
SODIUM: 139 mmol/L (ref 135–145)
SODIUM: 141 mmol/L (ref 135–145)
SODIUM: 133 mmol/L — AB (ref 135–145)
Sodium: 138 mmol/L (ref 135–145)

## 2016-06-20 LAB — GLUCOSE, CAPILLARY
GLUCOSE-CAPILLARY: 108 mg/dL — AB (ref 65–99)
GLUCOSE-CAPILLARY: 119 mg/dL — AB (ref 65–99)
GLUCOSE-CAPILLARY: 122 mg/dL — AB (ref 65–99)
GLUCOSE-CAPILLARY: 20 mg/dL — AB (ref 65–99)
GLUCOSE-CAPILLARY: 75 mg/dL (ref 65–99)
GLUCOSE-CAPILLARY: 78 mg/dL (ref 65–99)
Glucose-Capillary: 44 mg/dL — CL (ref 65–99)
Glucose-Capillary: 21 mg/dL — CL (ref 65–99)
Glucose-Capillary: 23 mg/dL — CL (ref 65–99)

## 2016-06-20 LAB — HEPATIC FUNCTION PANEL
ALBUMIN: 3 g/dL — AB (ref 3.5–5.0)
ALK PHOS: 74 U/L (ref 38–126)
ALT: 145 U/L — ABNORMAL HIGH (ref 17–63)
AST: 486 U/L — ABNORMAL HIGH (ref 15–41)
BILIRUBIN TOTAL: 0.7 mg/dL (ref 0.3–1.2)
Bilirubin, Direct: 0.1 mg/dL (ref 0.1–0.5)
Indirect Bilirubin: 0.6 mg/dL (ref 0.3–0.9)
Total Protein: 5.6 g/dL — ABNORMAL LOW (ref 6.5–8.1)

## 2016-06-20 LAB — CBC
HEMATOCRIT: 44.3 % (ref 39.0–52.0)
HEMOGLOBIN: 14.5 g/dL (ref 13.0–17.0)
MCH: 29.6 pg (ref 26.0–34.0)
MCHC: 32.7 g/dL (ref 30.0–36.0)
MCV: 90.4 fL (ref 78.0–100.0)
Platelets: 110 10*3/uL — ABNORMAL LOW (ref 150–400)
RBC: 4.9 MIL/uL (ref 4.22–5.81)
RDW: 14.6 % (ref 11.5–15.5)
WBC: 17.1 10*3/uL — AB (ref 4.0–10.5)

## 2016-06-20 LAB — LIPID PANEL
Cholesterol: 163 mg/dL (ref 0–200)
HDL: 44 mg/dL (ref >40)
LDL Cholesterol: 110 mg/dL — ABNORMAL HIGH (ref 0–99)
TRIGLYCERIDES: 43 mg/dL (ref <150)
Total CHOL/HDL Ratio: 3.7 RATIO
VLDL: 9 mg/dL (ref 0–40)

## 2016-06-20 LAB — POCT I-STAT 4, (NA,K, GLUC, HGB,HCT)
Glucose, Bld: 131 mg/dL — ABNORMAL HIGH (ref 65–99)
Glucose, Bld: 104 mg/dL — ABNORMAL HIGH (ref 65–99)
HCT: 40 % (ref 39.0–52.0)
HEMATOCRIT: 38 % — AB (ref 39.0–52.0)
HEMOGLOBIN: 13.6 g/dL (ref 13.0–17.0)
Hemoglobin: 12.9 g/dL — ABNORMAL LOW (ref 13.0–17.0)
POTASSIUM: 7.1 mmol/L — AB (ref 3.5–5.1)
Potassium: 7.2 mmol/L (ref 3.5–5.1)
SODIUM: 138 mmol/L (ref 135–145)
Sodium: 138 mmol/L (ref 135–145)

## 2016-06-20 LAB — TSH: TSH: 3.388 u[IU]/mL (ref 0.350–4.500)

## 2016-06-20 LAB — LACTIC ACID, PLASMA
LACTIC ACID, VENOUS: 5.6 mmol/L — AB (ref 0.5–1.9)
Lactic Acid, Venous: 4.7 mmol/L (ref 0.5–1.9)
Lactic Acid, Venous: 8.7 mmol/L (ref 0.5–1.9)

## 2016-06-20 LAB — TROPONIN I
TROPONIN I: 5.32 ng/mL — AB (ref <0.03)
TROPONIN I: 4.64 ng/mL — AB (ref <0.03)

## 2016-06-20 LAB — POTASSIUM: POTASSIUM: 7 mmol/L — AB (ref 3.5–5.1)

## 2016-06-20 LAB — CK: Total CK: >50000 U/L — ABNORMAL HIGH (ref 49–397)

## 2016-06-20 LAB — APTT: APTT: 30 s (ref 24–36)

## 2016-06-20 LAB — MRSA PCR SCREENING: MRSA by PCR: NEGATIVE

## 2016-06-20 LAB — PROTIME-INR
INR: 1.24
PROTHROMBIN TIME: 15.7 s — AB (ref 11.4–15.2)

## 2016-06-20 SURGERY — BYPASS GRAFT FEMORAL-POPLITEAL ARTERY
Anesthesia: General | Site: Leg Lower | Laterality: Right

## 2016-06-20 MED ORDER — DEXTROSE 50 % IV SOLN: 50.0000 mL | Freq: Once | INTRAVENOUS | Status: DC

## 2016-06-20 MED ORDER — SODIUM CHLORIDE 0.9 % IV SOLN
INTRAVENOUS | Status: DC
Start: 2016-06-20 — End: 2016-06-20
  Administered 2016-06-20: 2 [IU]/h via INTRAVENOUS
  Filled 2016-06-20: qty 2.5

## 2016-06-20 MED ORDER — MAGNESIUM HYDROXIDE 400 MG/5ML PO SUSP: 30.0000 mL | Freq: Every day | ORAL | Status: DC | PRN

## 2016-06-20 MED ORDER — BISACODYL 10 MG RE SUPP: 10.0000 mg | Freq: Every day | RECTAL | Status: DC | PRN

## 2016-06-20 MED ORDER — SODIUM POLYSTYRENE SULFONATE 15 GM/60ML PO SUSP
30.0000 g | Freq: Once | ORAL | Status: AC
  Administered 2016-06-20: 30 g via RECTAL
  Filled 2016-06-20: qty 120

## 2016-06-20 MED ORDER — ONDANSETRON HCL 4 MG/2ML IJ SOLN
INTRAMUSCULAR | Status: AC
  Filled 2016-06-20: qty 2

## 2016-06-20 MED ORDER — ALUM & MAG HYDROXIDE-SIMETH 200-200-20 MG/5ML PO SUSP: 15.0000 mL | ORAL | Status: DC | PRN

## 2016-06-20 MED ORDER — MAGNESIUM SULFATE 2 GM/50ML IV SOLN: 2.0000 g | Freq: Every day | INTRAVENOUS | Status: DC | PRN

## 2016-06-20 MED ORDER — SODIUM CHLORIDE 0.9 % IV BOLUS (SEPSIS)
1000.0000 mL | Freq: Once | INTRAVENOUS | Status: AC
  Administered 2016-06-20: 1000 mL via INTRAVENOUS

## 2016-06-20 MED ORDER — SUGAMMADEX SODIUM 500 MG/5ML IV SOLN
INTRAVENOUS | Status: AC
  Filled 2016-06-20: qty 5

## 2016-06-20 MED ORDER — PROPOFOL 10 MG/ML IV BOLUS
INTRAVENOUS | Status: DC | PRN
  Administered 2016-06-20: 110 mg via INTRAVENOUS

## 2016-06-20 MED ORDER — ASPIRIN EC 81 MG PO TBEC
81.0000 mg | DELAYED_RELEASE_TABLET | Freq: Every day | ORAL | Status: DC
Start: 2016-06-20 — End: 2016-06-21
  Administered 2016-06-20 – 2016-06-21 (×2): 81 mg via ORAL
  Filled 2016-06-20 (×2): qty 1

## 2016-06-20 MED ORDER — SUGAMMADEX SODIUM 500 MG/5ML IV SOLN
INTRAVENOUS | Status: DC | PRN
  Administered 2016-06-20: 500 mg via INTRAVENOUS

## 2016-06-20 MED ORDER — FENTANYL CITRATE (PF) 100 MCG/2ML IJ SOLN
INTRAMUSCULAR | Status: DC | PRN
  Administered 2016-06-20 (×2): 50 ug via INTRAVENOUS

## 2016-06-20 MED ORDER — SODIUM CHLORIDE 0.9 % IV SOLN
INTRAVENOUS | Status: DC
  Administered 2016-06-20: 16:00:00 via INTRAVENOUS

## 2016-06-20 MED ORDER — FENTANYL CITRATE (PF) 100 MCG/2ML IJ SOLN
INTRAMUSCULAR | Status: AC
  Filled 2016-06-20: qty 2

## 2016-06-20 MED ORDER — PANTOPRAZOLE SODIUM 40 MG PO TBEC: 40.0000 mg | DELAYED_RELEASE_TABLET | Freq: Every day | ORAL | Status: DC

## 2016-06-20 MED ORDER — IOPAMIDOL (ISOVUE-300) INJECTION 61%
INTRAVENOUS | Status: AC
  Filled 2016-06-20: qty 50

## 2016-06-20 MED ORDER — LACTATED RINGERS IV SOLN: INTRAVENOUS | Status: DC

## 2016-06-20 MED ORDER — HEPARIN SODIUM (PORCINE) 1000 UNIT/ML IJ SOLN
INTRAMUSCULAR | Status: DC | PRN
  Administered 2016-06-20: 7000 [IU] via INTRAVENOUS

## 2016-06-20 MED ORDER — DOCUSATE SODIUM 100 MG PO CAPS: 100.0000 mg | ORAL_CAPSULE | Freq: Every day | ORAL | Status: DC

## 2016-06-20 MED ORDER — VANCOMYCIN HCL IN DEXTROSE 1-5 GM/200ML-% IV SOLN
1000.0000 mg | Freq: Two times a day (BID) | INTRAVENOUS | Status: DC
  Administered 2016-06-20: 1000 mg via INTRAVENOUS
  Filled 2016-06-20 (×2): qty 200

## 2016-06-20 MED ORDER — 0.9 % SODIUM CHLORIDE (POUR BTL) OPTIME
TOPICAL | Status: DC | PRN
  Administered 2016-06-20: 2000 mL

## 2016-06-20 MED ORDER — HEPARIN (PORCINE) IN NACL 100-0.45 UNIT/ML-% IJ SOLN
1000.0000 [IU]/h | INTRAMUSCULAR | Status: DC
  Administered 2016-06-20: 1000 [IU]/h via INTRAVENOUS
  Filled 2016-06-20: qty 250

## 2016-06-20 MED ORDER — PHENOL 1.4 % MT LIQD: 1.0000 | OROMUCOSAL | Status: DC | PRN

## 2016-06-20 MED ORDER — SODIUM BICARBONATE 8.4 % IV SOLN
50.0000 meq | Freq: Once | INTRAVENOUS | Status: AC
  Administered 2016-06-20: 50 meq via INTRAVENOUS
  Filled 2016-06-20: qty 50

## 2016-06-20 MED ORDER — LABETALOL HCL 5 MG/ML IV SOLN
10.0000 mg | INTRAVENOUS | Status: DC | PRN
  Administered 2016-06-20: 10 mg via INTRAVENOUS
  Filled 2016-06-20: qty 4

## 2016-06-20 MED ORDER — VANCOMYCIN HCL 10 G IV SOLR
1500.0000 mg | Freq: Once | INTRAVENOUS | Status: AC
  Administered 2016-06-20: 1500 mg via INTRAVENOUS
  Filled 2016-06-20: qty 1500

## 2016-06-20 MED ORDER — DEXTROSE 50 % IV SOLN
25.0000 mL | Freq: Once | INTRAVENOUS | Status: AC
  Administered 2016-06-20: 25 mL via INTRAVENOUS

## 2016-06-20 MED ORDER — SODIUM CHLORIDE 0.9 % IV SOLN
INTRAVENOUS | Status: DC | PRN
  Administered 2016-06-20: 13:00:00

## 2016-06-20 MED ORDER — ORAL CARE MOUTH RINSE
15.0000 mL | Freq: Two times a day (BID) | OROMUCOSAL | Status: DC
  Administered 2016-06-20 – 2016-06-21 (×3): 15 mL via OROMUCOSAL

## 2016-06-20 MED ORDER — DOCUSATE SODIUM 100 MG PO CAPS
100.0000 mg | ORAL_CAPSULE | Freq: Every day | ORAL | Status: DC
  Administered 2016-06-21: 100 mg via ORAL
  Filled 2016-06-20: qty 1

## 2016-06-20 MED ORDER — PHENYLEPHRINE HCL 10 MG/ML IJ SOLN
INTRAMUSCULAR | Status: DC | PRN
  Administered 2016-06-20 (×2): 50 ug via INTRAVENOUS

## 2016-06-20 MED ORDER — GUAIFENESIN-DM 100-10 MG/5ML PO SYRP: 15.0000 mL | ORAL_SOLUTION | ORAL | Status: DC | PRN

## 2016-06-20 MED ORDER — STROKE: EARLY STAGES OF RECOVERY BOOK
Freq: Once | Status: DC
  Filled 2016-06-20: qty 1

## 2016-06-20 MED ORDER — ATORVASTATIN CALCIUM 20 MG PO TABS
20.0000 mg | ORAL_TABLET | Freq: Every day | ORAL | Status: DC
  Administered 2016-06-20: 20 mg via ORAL
  Filled 2016-06-20: qty 1

## 2016-06-20 MED ORDER — DEXTROSE 5 % IV SOLN
INTRAVENOUS | Status: DC | PRN
  Administered 2016-06-20: 60 ug/min via INTRAVENOUS

## 2016-06-20 MED ORDER — DEXTROSE 5 % IV SOLN
5.0000 mg/h | INTRAVENOUS | Status: DC
  Administered 2016-06-20 (×2): 5 mg/h via INTRAVENOUS
  Filled 2016-06-20: qty 100

## 2016-06-20 MED ORDER — METOPROLOL TARTRATE 5 MG/5ML IV SOLN: 2.0000 mg | INTRAVENOUS | Status: DC | PRN

## 2016-06-20 MED ORDER — SODIUM CHLORIDE 0.9 % IV SOLN
1.0000 g | Freq: Once | INTRAVENOUS | Status: AC
  Administered 2016-06-20: 1 g via INTRAVENOUS
  Filled 2016-06-20 (×2): qty 10

## 2016-06-20 MED ORDER — DEXTROSE 50 % IV SOLN
INTRAVENOUS | Status: AC
  Administered 2016-06-20: 50 mL
  Filled 2016-06-20: qty 50

## 2016-06-20 MED ORDER — DEXTROSE-NACL 5-0.9 % IV SOLN
INTRAVENOUS | Status: DC
  Administered 2016-06-20: 17:00:00 via INTRAVENOUS

## 2016-06-20 MED ORDER — MEPERIDINE HCL 25 MG/ML IJ SOLN: 6.2500 mg | INTRAMUSCULAR | Status: DC | PRN

## 2016-06-20 MED ORDER — ACETAMINOPHEN 325 MG PO TABS: 325.0000 mg | ORAL_TABLET | ORAL | Status: DC | PRN

## 2016-06-20 MED ORDER — HYDROCHLOROTHIAZIDE 25 MG PO TABS: 25.0000 mg | ORAL_TABLET | Freq: Every day | ORAL | Status: DC

## 2016-06-20 MED ORDER — LIDOCAINE HCL (CARDIAC) 20 MG/ML IV SOLN
INTRAVENOUS | Status: DC | PRN
  Administered 2016-06-20: 60 mg via INTRATRACHEAL

## 2016-06-20 MED ORDER — DEXTROSE 50 % IV SOLN
INTRAVENOUS | Status: AC
  Filled 2016-06-20: qty 50

## 2016-06-20 MED ORDER — FENTANYL CITRATE (PF) 100 MCG/2ML IJ SOLN: 25.0000 ug | INTRAMUSCULAR | Status: DC | PRN

## 2016-06-20 MED ORDER — POTASSIUM CHLORIDE CRYS ER 20 MEQ PO TBCR: 20.0000 meq | EXTENDED_RELEASE_TABLET | Freq: Every day | ORAL | Status: DC | PRN

## 2016-06-20 MED ORDER — SODIUM POLYSTYRENE SULFONATE 15 GM/60ML PO SUSP
30.0000 g | Freq: Once | ORAL | Status: AC
  Administered 2016-06-20: 30 g via ORAL
  Filled 2016-06-20: qty 120

## 2016-06-20 MED ORDER — DEXTROSE-NACL 5-0.45 % IV SOLN
INTRAVENOUS | Status: DC | PRN
  Administered 2016-06-20: 12:00:00 via INTRAVENOUS

## 2016-06-20 MED ORDER — ACETAMINOPHEN 325 MG RE SUPP: 325.0000 mg | RECTAL | Status: DC | PRN

## 2016-06-20 MED ORDER — BENAZEPRIL HCL 10 MG PO TABS: 20.0000 mg | ORAL_TABLET | Freq: Every day | ORAL | Status: DC

## 2016-06-20 MED ORDER — PROPOFOL 10 MG/ML IV BOLUS
INTRAVENOUS | Status: AC
  Filled 2016-06-20: qty 20

## 2016-06-20 MED ORDER — ONDANSETRON HCL 4 MG/2ML IJ SOLN
4.0000 mg | Freq: Four times a day (QID) | INTRAMUSCULAR | Status: DC | PRN
  Administered 2016-06-20 – 2016-06-21 (×2): 4 mg via INTRAVENOUS
  Filled 2016-06-20 (×2): qty 2

## 2016-06-20 MED ORDER — PROMETHAZINE HCL 25 MG/ML IJ SOLN: 6.2500 mg | INTRAMUSCULAR | Status: DC | PRN

## 2016-06-20 MED ORDER — DEXTROSE-NACL 5-0.45 % IV SOLN
INTRAVENOUS | Status: DC
  Administered 2016-06-20: 21:00:00 via INTRAVENOUS

## 2016-06-20 MED ORDER — INSULIN ASPART 100 UNIT/ML ~~LOC~~ SOLN
0.0000 [IU] | SUBCUTANEOUS | Status: DC
  Administered 2016-06-20 (×2): 5 [IU] via SUBCUTANEOUS

## 2016-06-20 MED ORDER — DEXTROSE 50 % IV SOLN
INTRAVENOUS | Status: DC | PRN
  Administered 2016-06-20: 12.5 g via INTRAVENOUS

## 2016-06-20 MED ORDER — ROCURONIUM 10MG/ML (10ML) SYRINGE FOR MEDFUSION PUMP - OPTIME
INTRAVENOUS | Status: DC | PRN
  Administered 2016-06-20: 70 mg via INTRAVENOUS

## 2016-06-20 MED ORDER — SODIUM CHLORIDE 0.9 % IV SOLN
INTRAVENOUS | Status: AC
  Administered 2016-06-20: 3 [IU]/h via INTRAVENOUS
  Filled 2016-06-20: qty 2.5

## 2016-06-20 MED ORDER — SODIUM CHLORIDE 0.9 % IV SOLN: 500.0000 mL | Freq: Once | INTRAVENOUS | Status: DC | PRN

## 2016-06-20 MED ORDER — MORPHINE SULFATE (PF) 2 MG/ML IV SOLN
2.0000 mg | INTRAVENOUS | Status: DC | PRN
  Administered 2016-06-20 – 2016-06-21 (×7): 2 mg via INTRAVENOUS
  Administered 2016-06-21: 4 mg via INTRAVENOUS
  Filled 2016-06-20: qty 1
  Filled 2016-06-20: qty 2
  Filled 2016-06-20 (×6): qty 1

## 2016-06-20 MED ORDER — BENAZEPRIL-HYDROCHLOROTHIAZIDE 20-25 MG PO TABS: 1.0000 | ORAL_TABLET | Freq: Every day | ORAL | Status: DC

## 2016-06-20 MED ORDER — SODIUM CHLORIDE 0.9 % IV SOLN
1.0000 g | Freq: Once | INTRAVENOUS | Status: AC
  Administered 2016-06-20: 1 g via INTRAVENOUS
  Filled 2016-06-20: qty 10

## 2016-06-20 MED ORDER — SODIUM POLYSTYRENE SULFONATE 15 GM/60ML PO SUSP: 30.0000 g | Freq: Once | ORAL | Status: DC

## 2016-06-20 MED ORDER — HYDRALAZINE HCL 20 MG/ML IJ SOLN
5.0000 mg | INTRAMUSCULAR | Status: DC | PRN
  Administered 2016-06-20: 5 mg via INTRAVENOUS
  Filled 2016-06-20: qty 1

## 2016-06-20 MED ORDER — OXYCODONE-ACETAMINOPHEN 5-325 MG PO TABS: 1.0000 | ORAL_TABLET | ORAL | Status: DC | PRN

## 2016-06-20 MED ORDER — SODIUM CHLORIDE 0.9 % IV SOLN
INTRAVENOUS | Status: DC
  Administered 2016-06-20: 20:00:00 via INTRAVENOUS

## 2016-06-20 MED ORDER — LIDOCAINE 2% (20 MG/ML) 5 ML SYRINGE
INTRAMUSCULAR | Status: AC
  Filled 2016-06-20: qty 5

## 2016-06-20 MED ORDER — DEXTROSE 50 % IV SOLN
1.0000 | INTRAVENOUS | Status: DC | PRN
  Filled 2016-06-20: qty 50

## 2016-06-20 SURGICAL SUPPLY — 59 items
BANDAGE ESMARK 6X9 LF (GAUZE/BANDAGES/DRESSINGS) IMPLANT
BENZOIN TINCTURE PRP APPL 2/3 (GAUZE/BANDAGES/DRESSINGS) ×3 IMPLANT
BNDG ESMARK 6X9 LF (GAUZE/BANDAGES/DRESSINGS)
BNDG GAUZE ELAST 4 BULKY (GAUZE/BANDAGES/DRESSINGS) ×3 IMPLANT
CANISTER SUCTION 2500CC (MISCELLANEOUS) ×3 IMPLANT
CANNULA VESSEL 3MM 2 BLNT TIP (CANNULA) ×6 IMPLANT
CATH EMB 3FR 80CM (CATHETERS) ×3 IMPLANT
CLIP LIGATING EXTRA MED SLVR (CLIP) ×3 IMPLANT
CLIP LIGATING EXTRA SM BLUE (MISCELLANEOUS) ×3 IMPLANT
CLOSURE WOUND 1/2 X4 (GAUZE/BANDAGES/DRESSINGS) ×1
CUFF TOURNIQUET SINGLE 34IN LL (TOURNIQUET CUFF) IMPLANT
CUFF TOURNIQUET SINGLE 44IN (TOURNIQUET CUFF) IMPLANT
DRAIN SNY 10X20 3/4 PERF (WOUND CARE) IMPLANT
DRAPE PROXIMA HALF (DRAPES) IMPLANT
DRAPE X-RAY CASS 24X20 (DRAPES) IMPLANT
DRSG COVADERM 4X10 (GAUZE/BANDAGES/DRESSINGS) IMPLANT
DRSG COVADERM 4X8 (GAUZE/BANDAGES/DRESSINGS) IMPLANT
DRSG PAD ABDOMINAL 8X10 ST (GAUZE/BANDAGES/DRESSINGS) ×3 IMPLANT
ELECT REM PT RETURN 9FT ADLT (ELECTROSURGICAL) ×3
ELECTRODE REM PT RTRN 9FT ADLT (ELECTROSURGICAL) ×1 IMPLANT
EVACUATOR SILICONE 100CC (DRAIN) IMPLANT
GAUZE SPONGE 4X4 12PLY STRL (GAUZE/BANDAGES/DRESSINGS) ×3 IMPLANT
GLOVE BIO SURGEON STRL SZ 6.5 (GLOVE) ×4 IMPLANT
GLOVE BIO SURGEON STRL SZ7.5 (GLOVE) ×3 IMPLANT
GLOVE BIO SURGEONS STRL SZ 6.5 (GLOVE) ×2
GLOVE BIOGEL PI IND STRL 6.5 (GLOVE) ×2 IMPLANT
GLOVE BIOGEL PI IND STRL 7.5 (GLOVE) ×1 IMPLANT
GLOVE BIOGEL PI INDICATOR 6.5 (GLOVE) ×4
GLOVE BIOGEL PI INDICATOR 7.5 (GLOVE) ×2
GLOVE SS BIOGEL STRL SZ 7.5 (GLOVE) ×1 IMPLANT
GLOVE SUPERSENSE BIOGEL SZ 7.5 (GLOVE) ×2
GOWN STRL REUS W/ TWL LRG LVL3 (GOWN DISPOSABLE) ×3 IMPLANT
GOWN STRL REUS W/TWL LRG LVL3 (GOWN DISPOSABLE) ×6
INSERT FOGARTY SM (MISCELLANEOUS) IMPLANT
KIT BASIN OR (CUSTOM PROCEDURE TRAY) ×3 IMPLANT
KIT ROOM TURNOVER OR (KITS) ×3 IMPLANT
NS IRRIG 1000ML POUR BTL (IV SOLUTION) ×6 IMPLANT
PACK PERIPHERAL VASCULAR (CUSTOM PROCEDURE TRAY) ×3 IMPLANT
PAD ARMBOARD 7.5X6 YLW CONV (MISCELLANEOUS) ×6 IMPLANT
PADDING CAST COTTON 6X4 STRL (CAST SUPPLIES) IMPLANT
SET COLLECT BLD 21X3/4 12 (NEEDLE) IMPLANT
SPONGE GAUZE 4X4 12PLY STER LF (GAUZE/BANDAGES/DRESSINGS) ×3 IMPLANT
SPONGE LAP 18X18 X RAY DECT (DISPOSABLE) ×3 IMPLANT
STAPLER VISISTAT 35W (STAPLE) IMPLANT
STOPCOCK 4 WAY LG BORE MALE ST (IV SETS) IMPLANT
STRIP CLOSURE SKIN 1/2X4 (GAUZE/BANDAGES/DRESSINGS) ×2 IMPLANT
SUT ETHILON 3 0 PS 1 (SUTURE) IMPLANT
SUT PROLENE 5 0 C 1 24 (SUTURE) ×3 IMPLANT
SUT PROLENE 6 0 CC (SUTURE) ×3 IMPLANT
SUT SILK 2 0 SH (SUTURE) ×3 IMPLANT
SUT VIC AB 2-0 CTX 36 (SUTURE) ×6 IMPLANT
SUT VIC AB 3-0 SH 27 (SUTURE) ×4
SUT VIC AB 3-0 SH 27X BRD (SUTURE) ×2 IMPLANT
SYR 3ML LL SCALE MARK (SYRINGE) ×3 IMPLANT
TAPE CLOTH SURG 4X10 WHT LF (GAUZE/BANDAGES/DRESSINGS) ×3 IMPLANT
TRAY FOLEY W/METER SILVER 16FR (SET/KITS/TRAYS/PACK) ×3 IMPLANT
TUBING EXTENTION W/L.L. (IV SETS) IMPLANT
UNDERPAD 30X30 (UNDERPADS AND DIAPERS) ×3 IMPLANT
WATER STERILE IRR 1000ML POUR (IV SOLUTION) ×3 IMPLANT

## 2016-06-20 NOTE — Progress Notes (Signed)
CRITICAL VALUE ALERT  Critical value received:  Potassium 6.4  Date of notification:  04-28-2016  Time of notification:  2020  Critical value read back: Yes  Nurse who received alert:  Feliberto GottronBrad Lafern Brinkley RN  MD notified (1st page):  Allena KatzPatel MD  Time of first page:  2045  MD notified (2nd page):  Time of second page:  Responding MD:  Allena KatzPatel MD  Time MD responded:  2045

## 2016-06-20 NOTE — Progress Notes (Signed)
eLink Physician-Brief Progress Note Patient Name: Luis MilesJohn Sellers DOB: 11-Jul-1941 MRN: [id number]   Date of Service  07/03/2016  HPI/Events of Note  Hypoglycemia - Blood glucose = 43. D50 IV given. Already on D5 0.9 NaCl at 125 mL/hour. Last K+ = 6.4. Kayexalate now ordered.   eICU Interventions  Will order: 1. D/C Insulin IV infusion. Follow blood glucose.      Intervention Category Major Interventions: Other:  [name] 06/26/2016, 7:03 PM

## 2016-06-20 NOTE — Progress Notes (Signed)
Pharmacy Antibiotic Note Luis Sellers is a 75 y.o. male admitted on 06/15/2016 that is s/p emergent fasciotomy. Pharmacy has been consulted for vancomycin dosing for surgical prophylaxis.  Plan: 1. Will give vancomycin 1500 mg x 1 this evening (post op) 2. Pharmacy to sign off, please re consult if longer course of vancomycin felt to needed    Height: 5' 8.9" (175 cm) Weight: 275 lb (124.7 kg) IBW/kg (Calculated) : 70.47  Temp (24hrs), Avg:97.6 F (36.4 C), Min:96.8 F (36 C), Max:98.2 F (36.8 C)   Recent Labs Lab 06/29/2016 1222 06/29/2016 1230 07/04/2016 0415 06/15/2016 0648 06/19/2016 1244  WBC 10.1  --  17.1*  --   --   CREATININE 1.11 1.10 1.52*  --   --   LATICACIDVEN  --   --   --  5.6* 4.7*    Estimated Creatinine Clearance: 53.9 mL/min (by C-G formula based on SCr of 1.52 mg/dL).    Allergies  Allergen Reactions  . Lactose Intolerance (Gi) Diarrhea and Other (See Comments)    Stomach pains  . Penicillins Rash    Has patient had a PCN reaction causing immediate rash, facial/tongue/throat swelling, SOB or lightheadedness with hypotension: Yes Has patient had a PCN reaction causing severe rash involving mucus membranes or skin necrosis: No Has patient had a PCN reaction that required hospitalization No Has patient had a PCN reaction occurring within the last 10 years: No If all of the above answers are "NO", then may proceed with Cephalosporin use.    Antimicrobials this admission: 9/10 vancomycin (3 doses total)   Thank you for allowing pharmacy to be a part of this patient's care.  Pollyann SamplesAndy Kathleen Tamm, PharmD, BCPS 07/06/2016, 3:57 PM Pager: [phone]

## 2016-06-20 NOTE — Progress Notes (Signed)
STROKE TEAM PROGRESS NOTE   HISTORY OF PRESENT ILLNESS (per record) Luis Sellers is a 75 y.o. male with a history of hypertension who presents with acute right leg weakness that started around 10:30 am. He states that he got up and noticed that he wasn't able to move the leg very well.  He was brought to the ER where it was noticed that he had markedly discordant pulses in the right arm and leg compared to the left. He denies pain.  He was taken for emergent CTA and then MRI brain. The MRI shows acute cerebellar infarcts, but I do not think that these are responsible for his symptoms. CTA shows occluded right femoral and internal iliac vessels   LKW: 10:30 am.  tpa given?: no, mild symptoms(of stroke)   SUBJECTIVE (INTERVAL HISTORY) His daughter is at the bedside.  Overall he feels his condition is stable. He had 2 vascular surgeries with thrombectomy since admission. Neuro stable, denies any vertigo, nausea vomiting. Right lower stream is still weak due to ischemic changes. Newly diagnosed A. Fib, still on A. fib RVR, on IV Cardizem and IV heparin.    OBJECTIVE Temp:  [96.8 F (36 C)-98.2 F (36.8 C)] 97.5 F (36.4 C) (09/10 1548) Pulse Rate:  [25-98] 32 (09/10 1500) Cardiac Rhythm: Atrial fibrillation (09/10 1400) Resp:  [10-34] 11 (09/10 1500) BP: (111-164)/(67-128) 128/89 (09/10 1500) SpO2:  [90 %-100 %] 98 % (09/10 1500)  CBC:   Recent Labs Lab 07/10/2016 1222  July 19, 2016 0415 07/19/2016 1139 July 19, 2016 1239  WBC 10.1  --  17.1*  --   --   NEUTROABS 6.8  --   --   --   --   HGB 14.4  < > 14.5 12.9* 13.6  HCT 45.2  < > 44.3 38.0* 40.0  MCV 90.6  --  90.4  --   --   PLT 173  --  110*  --   --   < > = values in this interval not displayed.  Basic Metabolic Panel:   Recent Labs Lab 19-Jul-2016 0415  2016/07/19 1239 07-19-16 1556  NA 138  < > 138 139  K 7.3*  < > 7.1* 6.4*  CL 105  --   --  108  CO2 18*  --   --  20*  GLUCOSE 147*  < > 104* 95  BUN 21*  --   --  31*   CREATININE 1.52*  --   --  2.96*  CALCIUM 7.9*  --   --  7.1*  < > = values in this interval not displayed.  Lipid Panel:     Component Value Date/Time   CHOL 163 07-19-2016 0415   TRIG 43 19-Jul-2016 0415   HDL 44 07/19/2016 0415   CHOLHDL 3.7 07/19/2016 0415   VLDL 9 07-19-16 0415   LDLCALC 110 (H) July 19, 2016 0415   HgbA1c:  Lab Results  Component Value Date   HGBA1C 5.8 (H) 07/29/2011   Urine Drug Screen: No results found for: LABOPIA, COCAINSCRNUR, LABBENZ, AMPHETMU, THCU, LABBARB    IMAGING I have personally reviewed the radiological images below and agree with the radiology interpretations.  [name] Head Wo Contrast 07/03/2016 1. 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  2. Small left vertebral artery essentially terminates at the PICA.  3. 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  4. Fetal type right posterior cerebral artery.  5. Moderate stenosis of distal left M2 branch with asymmetric attenuation of distal left  M3 branches.  6. 3 mm left peri-ophthalmic artery aneurysm.   Ct Angio Chest/abd/pel For Dissection W And/or Wo Contrast Jul 19, 2016  Vascular Impression of the chest:  1. 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 The additional branch vessels of the aortic arch are tortuous though patent throughout their imaged course.  2. 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 Recommend annual imaging followup by CTA or MRA.  3. Cardiomegaly with calcifications within the aortic valve leaflets, nonspecific though could be seen in the setting of aortic stenosis. Enlarged caliber the main pulmonary artery, nonspecific though could be seen in the setting of pulmonary arterial hypertension. Further evaluation cardiac echo could performed as clinically indicated.  4. Atherosclerosis including  coronary artery calcifications. Aortic Atherosclerosis (ICD10-170.0)   Nonvascular Impression of the chest:  1. Mild apical and peripheral/subpleural predominant centrilobular emphysematous change.  Emphysema. (ICD10-J43.9)  2. Suspected superimposed airways disease/bronchitis. No focal airspace opacities to suggest pneumonia.   Vascular Impression of the abdomen and pelvis:  1. Presumably acute intraluminal thrombus within the aortic bifurcation extending to occlude the left common iliac artery and result in a focal severe (> 90%) luminal narrowing of the right common iliac artery. No evidence of abdominal aortic aneurysm or dissection. Given history of recent diagnosis of atrial fibrillation, this thrombus may be of cardiogenic origin. Further evaluation with cardiac echo is recommended.  2. The right common, superficial and deep femoral arteries are occluded throughout their course. Additionally, the right internal iliac artery is occluded.  3. 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 There is reconstitution of the distal aspect of the left external iliac artery via supply from the ipsilateral left inferior epigastric and deep iliac circumflex arteries. The left common, superficial and deep femoral arteries appear patent.  4. Occlusion of a subsegmental branch of the cranial division of the right renal artery with associated evolving infarct involving the anterior/superior aspect the right kidney.  5. Age-indeterminate occlusion involving the origin and proximal aspect of the IMA with early reconstitution via collateral supply from the SMA.   Nonvascular Impression of the abdomen and pelvis:  1. No evidence of mesenteric ischemia. Specifically, no discrete areas of bowel wall thickening, pneumatosis or portal venous gas.   Ct Head Code Stroke W/o Cm July 19, 2016 1. No acute intracranial abnormality. 2. Moderate diffuse white matter disease likely reflects the sequela of chronic  microvascular ischemia.  3. 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  4. ASPECTS is 10/10   Carotid Doppler There is no obvious evidence of hemodynamically significant internal carotid artery stenosis bilaterally. Right vertebral artery is patent with antegrade flow. Unable to visualize the left vertebral artery.  2-D echo pending   PHYSICAL EXAM  Temp:  [96.8 F (36 C)-98.2 F (36.8 C)] 97.5 F (36.4 C) (09/10 1548) Pulse Rate:  [25-98] 32 (09/10 1500) Resp:  [10-34] 11 (09/10 1500) BP: (111-164)/(67-128) 128/89 (09/10 1500) SpO2:  [90 %-100 %] 98 % (09/10 1500)  General - Well nourished, well developed, in no apparent distress.  Ophthalmologic - Fundi not visualized.  Cardiovascular - irregularly irregular heart rate and rhythm, tachycardia with HR 140s.  Mental Status -  Level of arousal and orientation to time, place, and person were intact. Language including expression, naming, repetition, comprehension was assessed and found intact. Fund of Knowledge was assessed and was intact.  Cranial Nerves II - XII -  II - Visual field intact OU. III, IV, VI - Extraocular movements intact. V - Facial sensation intact bilaterally. VII - Facial movement intact bilaterally. VIII - Hearing & vestibular intact bilaterally. X - Palate elevates symmetrically. XI - Chin turning & shoulder shrug intact bilaterally. XII - Tongue protrusion intact.  Motor Strength - The patient's strength was normal in all extremities except RLE 2/5 hip flexion and knee extension, 0/5 DF and 3/5 PF and pronator drift was absent.  Bulk was normal and fasciculations were absent.   Motor Tone - Muscle tone was assessed at the neck and appendages and was normal.  Reflexes - The patient's reflexes were 1+ in all extremities and he had no pathological reflexes.  Sensory - Light touch, temperature/pinprick were assessed and were decreased on the RLE.    Coordination - The patient had LUE dysmetria on  FTN, BLEs not able to test.  Tremor was absent.  Gait and Station - not tested    ASSESSMENT/PLAN Luis Sellers is a 75 y.o. male with history of hypertension presenting with acute right leg weakness. He did not receive IV t-PA due to mild deficits.   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   MRA - Moderate stenosis of distal left M2 branch. 3 mm left peri-ophthalmic artery aneurysm.   Carotid Doppler - no significant stenosis.  2D Echo - pending  LDL - 110  HgbA1c pending  VTE prophylaxis - SCDs and IV heparin   No antithrombotic prior to admission, now on ASA 81 mg daily and IV heparin   Ongoing aggressive stroke risk factor management  Therapy recommendations: Pending  Disposition: Pending  Newly diagnosed A. fib with RVR  Likely the cause of embolic events  On IV Cardizem  On IV heparin and aspirin 81  Cardiology on board  Continue anticoagulation for embolic prevention  Hypertension  Stable  Long-term BP goal normotensive  Hyperlipidemia  Home meds: No lipid lowering medications prior to admission  LDL 110 , goal < 70  Add statin 20mg  daily  Continue statin at discharge  Tobacco abuse  Current smoker  Smoking cessation counseling provided  Pt is willing to quit  Other Stroke Risk Factors  Advanced age  Cigarette smoker - quit  Obesity, Body mass index is 40.73 kg/m., recommend weight loss, diet and exercise as appropriate  Other Active Problems  3 mm left peri-ophthalmic artery aneurysm.  Outpatient follow-up  Leukocytosis - 17.1  Hypokalemia - 7.0  Renal insufficiency - 21 / 1.52  Elevated troponin enzymes  Hospital day # 1  Neurology will sign off. Please call with questions. [facility]. Thanks for the consult.  Marvel PlanJindong Kingstyn Deruiter, MD PhD Stroke Neurology 07/05/2016 5:12 PM    To contact Stroke Continuity provider, please refer to [email or web address]. After hours, contact General Neurology

## 2016-06-20 NOTE — Transfer of Care (Signed)
Immediate Anesthesia Transfer of Care Note  Patient: Elijio MilesJohn Pustejovsky  Procedure(s) Performed: Procedure(s): Exploritory Tibo-peroneal artery, Thrombectomy  peroneal  artery with vein patch angioplasy. (Right) FASCIECTOMY four compartment. (Right)  Patient Location: PACU  Anesthesia Type:General  Level of Consciousness: awake  Airway & Oxygen Therapy: Patient Spontanous Breathing and Patient connected to nasal cannula oxygen  Post-op Assessment: Report given to RN and Post -op Vital signs reviewed and stable  Post vital signs: Reviewed and stable  Last Vitals:  Vitals:   11/24/2015 1030 11/24/2015 1045  BP: (!) 147/113 (!) 141/74  Pulse:  (!) 56  Resp: 19 17  Temp:      Last Pain:  Vitals:   11/24/2015 0830  TempSrc:   PainSc: 6       Patients Stated Pain Goal: 2 (11/24/2015 0830)  Complications: No apparent anesthesia complications

## 2016-06-20 NOTE — Progress Notes (Signed)
MD Early paged regarding inability to doppler patient's pulses in R Lower Extremity and loss of sensation. MD to follow up. Will continue to monitor closely.   Marlou PorchBradley Crews Mccollam

## 2016-06-20 NOTE — Progress Notes (Signed)
Patient ID: Luis Sellers, male   DOB: [birth year], 75 y.o.   MRN: [id number] Alert next abated in the surgical intensive care unit. Heart rate 100 and blood pressure 120 systolic. Did have oozing from right subclavian incision which is stopped with pressure. Good grip strength in his right arm. Continues to have diminished motor and sensory function in his right foot  Troponin level 5.3  Discussed with cardiology who will consult regarding new diagnosis of atrial fibrillation, marked elevation in troponin and palpable atrial embolus

## 2016-06-20 NOTE — Progress Notes (Signed)
CRITICAL VALUE ALERT  Critical value received:  Troponin 5.32  Date of notification:  07/08/2016  Time of notification:  0110  Critical value read back:Yes  Nurse who received alert:  Feliberto GottronBrad Lindsy Cerullo RN  MD notified (1st page):  Gretta Beganodd Early MD  Time of first page:  0120  MD notified (2nd page):  Time of second page:  Responding MD: Gretta Beganodd Early  Time MD responded:  [phone]

## 2016-06-20 NOTE — Progress Notes (Signed)
Patient seen by request of Internal Medicine Teaching Service day team who are consulting on Mr. Zonia KiefStephens. He has multiple arterial thromboembolism with critical limb ischemia s/p thrombectomies of multiple vessels and fasciotomy of RLE. CK is elevated (>50,000) with rising serum creatinine concerning for acute renal failure secondary to rhabdomyolysis. Urine output is minimal. Lactic acid is rising from 4.7 to 8.7. Potassium >7.0 today and he was started on an insulin drip in addition to kayexalate and calcium gluconate. He was hypoglycemic after his surgical procedure today and started on D5-1/2 NS with discontinuation of the insulin drip. The most recent potassium is 6.4 with recent administration of kayexalate and calcium gluconate.   Patient currently denies any hypoglycemic symptoms, chest pain, dyspnea, abdominal or suprapubic pain. He is resting comfortably other than some discomfort at his RLE surgical site.   General: resting in bed comfortably Cardiac: irregularly irregular Pulm: clear to auscultation bilaterally, moving normal volumes of air Abd: soft, nontender, nondistended GU: foley catheter in place, scant red urine in bag Ext: RLE with dry dressing in place, pedal pulses not palpable by hand Neuro: alert and oriented X3  A/P: Patient with ARF likely secondary to rhabdomyolysis. Will need to continue with aggressive IVF resuscitation while monitoring urine output and volume status. No known history of heart failure, TTE is pending. PCCM plan on central line placement to monitor CVP. On D5-1/2 NS now due to continued hypoglycemia. Repeat EKG shows decreased amplitude of T waves compared to prior. -Continue D5-1/2 NS for now - transition to normal saline when glucose stabilizes -Given 2 L NS boluses -Strict I/Os, monitor UOP -Consider Lasix -Collect UA when able -Give NaHCO3, goal urine pH >6.5 -Central line placement for CVP monitoring -Monitor creatinine and potassium, repeat  kayexalate if elevated -serial EKGs -f/u CK, lactic acid   Darreld McleanVishal Umaiza Matusik, MD IMTS PGY-2 Pager: [phone]

## 2016-06-20 NOTE — Op Note (Signed)
    OPERATIVE REPORT  DATE OF SURGERY: 06/18/2016  PATIENT: Luis Sellers, 75 y.o. male MRN: [id number]  DOB: September 03, 1941  PRE-OPERATIVE DIAGNOSIS: Persistent ischemia right lower extremity  POST-OPERATIVE DIAGNOSIS:  Same  PROCEDURE: Right popliteal exploration with the thrombectomy of peroneal artery and vein patch angioplasty from popliteal down onto the posterior tibial artery. Lateral to compartment fasciotomy and extension of medial fasciotomy    SURGEON:  Luis Sellers, M.D.  PHYSICIAN ASSISTANT: Luis Rhyne PA-C  ANESTHESIA:  Gen. 100  EBL: 100 ml  Total I/O In: 1810 [I.V.:1700; IV Piggyback:110] Out: 120 [Urine:20; Blood:100]  BLOOD ADMINISTERED: None  DRAINS: None  SPECIMEN: None  COUNTS CORRECT:  YES  PLAN OF CARE: PACU   PATIENT DISPOSITION:  PACU - hemodynamically stable  PROCEDURE DETAILS: Patient status post a presentation to the emergency room yesterday with a critical ischemia of his right arm and both lower extremities. Underwent extensive surgery on all 3 limbs yesterday. Morning it continues to have ischemia of his left foot. Has a faint peroneal Doppler flow but this foot is cool and he has no motor or sensory function. This is consistent with his exam prior to surgery. Patient understands this is a high risk for limb loss. Have recommended return for one additional attempt at thrombectomy. Intraoperative arteriogram yesterday revealed single-vessel runoff via the peroneal artery.  Patient was taken to the operative placed supine position where the area the right groin right leg prepped and draped in usual sterile fashion. The medial popliteal incision was opened. The patient had a strong popliteal pulse. Further extension was made distally on the anterior tibial and posterior tibial and peroneal arteries to get these all out individually. The patient was given 7000 units intravenous heparin. After adequate circulation time the popliteal artery was  occluded proximally the tibial vessels were occluded. The posterior tibial artery was opened on its anterior surface and extended with Potts scissors onto the popliteal artery. 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 The orifice of the peroneal artery was exposed and a 3 Fogarty catheter was passed down the nearly to the level of the ankle. There was some additional subacute thrombus removed from this. Additional passes showed no further thrombus and there was backbleeding. Next the orifice of the anterior tibial was exposed 30 catheter was passed through this but it would not pass beyond approximately 10 cm with chronic occlusion. The saphenous vein was harvested through the popliteal incision. The vein was spatulated and was sewn as a vein patch angioplasty beginning on the popliteal artery and extending onto the posterior tibial artery with a running 6-0 Prolene suture. Clamps were removed and good Doppler flow was noted through the peroneal artery. The wound irrigated with saline and hemostasis tablet cautery. The incision was extended slightly further distally and the fascia was opened further distally. Finally a separate incision was made on the lateral aspect of the calf but between the level of the anterior and posterior muscle bodies. The fascia was opened at the septum between the anterior and posterior muscle bodies. There was no evidence of any nonviable muscle. The wound irrigated with saline and hemostasis tablet cautery. The wounds packed with Betadine soaked Kerlix and a sterile dressing was applied over the fasciotomy incision   Luis Sellers, M.D. 06/28/2016 4:36 PM

## 2016-06-20 NOTE — Progress Notes (Signed)
Subjective: Interval History: none.. Comfortable. Reports he feels better. Denies any abdominal pain   Objective: Vital signs in last 24 hours: Temp:  [97.5 F (36.4 C)-98.1 F (36.7 C)] 97.5 F (36.4 C) (09/10 0435) Pulse Rate:  [25-130] 25 (09/10 0600) Resp:  [11-34] 17 (09/10 0600) BP: (75-231)/(62-135) 144/117 (09/10 0600) SpO2:  [85 %-100 %] 100 % (09/10 0600) Weight:  [275 lb (124.7 kg)] 275 lb (124.7 kg) (09/09 1229)  Intake/Output from previous day: 09/09 0701 - 09/10 0700 In: 3550 [I.V.:3350; IV Piggyback:200] Out: 2105 [Urine:1805; Blood:300] Intake/Output this shift: Total I/O In: 2550 [I.V.:2350; IV Piggyback:200] Out: 775 [Urine:625; Blood:150]  Dressings intact in all incisions. Left foot warm. Motor and sensory intact on the left lower extremity. Week Doppler flow at the peroneal. Right leg more cool to the touch onto his foot. No motor or sensory function. Right arm with good motor and sensory function. Weak radial signal. Left arm with the diminished radial pulse.  Lab Results:  Recent Labs  06/15/2016 1222 06/26/2016 1230 06/18/2016 0415  WBC 10.1  --  17.1*  HGB 14.4 15.6 14.5  HCT 45.2 46.0 44.3  PLT 173  --  110*   BMET  Recent Labs  06/18/2016 1222 06/18/2016 1230 07/03/2016 0415  NA 139 143 138  K 3.2* 3.2* 7.3*  CL 110 106 105  CO2 19*  --  18*  GLUCOSE 195* 194* 147*  BUN 19 21* 21*  CREATININE 1.11 1.10 1.52*  CALCIUM 8.9  --  7.9*    [facility]/Results: [facility]  Result Date: 06/28/2016 CLINICAL DATA:  Acute onset of right leg weakness. The examination had to be discontinued prior to completion due to stroke neurologist requested diffusion only. EXAM: MRI HEAD WITHOUT CONTRAST MRA HEAD WITHOUT CONTRAST TECHNIQUE: Multiplanar, multiecho pulse sequences of the brain and surrounding structures were obtained without intravenous contrast. Angiographic images of the head were obtained using MRA technique without contrast. COMPARISON:  CT  head without contrast from the same day. FINDINGS: MRI HEAD FINDINGS Linear areas of restricted diffusion are present within the medial cerebellar hemispheres bilaterally, slightly larger on the left. No acute supratentorial infarct is present. 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 This is directed superiorly. The internal carotid arteries are otherwise within normal limits bilaterally. The A1 and M1 segments are normal. The MCA bifurcation is intact bilaterally There is asymmetric attenuation of posterior left MCA branch vessels with a moderate distal left M2 stenosis. The right vertebral artery is the dominant vessel. The left vertebral artery terminates at the PICA. There is a fenestration of the vertebrobasilar junction without aneurysm. The basilar artery is somewhat small. The right posterior cerebral artery is of fetal type. The left posterior cerebral artery originates from basilar tip. A left posterior communicating artery contributes as well. There is asymmetric attenuation of distal PCA branch vessels, worse on the right. IMPRESSION: 1. 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 2. Small left vertebral artery essentially terminates at the PICA. 3. 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 4. Fetal type right posterior cerebral artery. 5. Moderate stenosis of distal left M2 branch with asymmetric attenuation of distal left M3 branches. 6. 3 mm left peri-ophthalmic artery aneurysm. These results were called by telephone at the time of interpretation on 07/02/2016 at 1:20 pm to  Dr. Ritta Slot , who verbally acknowledged these results. Electronically Signed   By: Marin Roberts M.D.   On: 06/22/2016 14:09   Mr Brain Ltd W/o Cm  Result Date: Jun 22, 2016 CLINICAL DATA:  Acute  onset of right leg weakness. The examination had to be discontinued prior to completion due to stroke neurologist requested diffusion only. EXAM: MRI HEAD WITHOUT CONTRAST MRA HEAD WITHOUT CONTRAST TECHNIQUE: Multiplanar, multiecho pulse sequences of the brain and surrounding structures were obtained without intravenous contrast. Angiographic images of the head were obtained using MRA technique without contrast. COMPARISON:  CT head without contrast from the same day. FINDINGS: MRI HEAD FINDINGS Linear areas of restricted diffusion are present within the medial cerebellar hemispheres bilaterally, slightly larger on the left. No acute supratentorial infarct is present. 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 This is directed superiorly. The internal carotid arteries are otherwise within normal limits bilaterally. The A1 and M1 segments are normal. The MCA bifurcation is intact bilaterally There is asymmetric attenuation of posterior left MCA branch vessels with a moderate distal left M2 stenosis. The right vertebral artery is the dominant vessel. The left vertebral artery terminates at the PICA. There is a fenestration of the vertebrobasilar junction without aneurysm. The basilar artery is somewhat small. The right posterior cerebral artery is of fetal type. The left posterior cerebral artery originates from basilar tip. A left posterior communicating artery contributes as well. There is asymmetric attenuation of distal PCA branch vessels, worse on the right. IMPRESSION: 1. 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 2. Small left vertebral artery essentially terminates at the PICA. 3. 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 4. Fetal type right posterior cerebral artery. 5. Moderate stenosis of  distal left M2 branch with asymmetric attenuation of distal left M3 branches. 6. 3 mm left peri-ophthalmic artery aneurysm. These results were called by telephone at the time of interpretation on 22-Jun-2016 at 1:20 pm to Dr. Ritta Slot , who verbally acknowledged these results. Electronically Signed   By: Marin Roberts M.D.   On: 06-22-16 14:09   Ct Angio Chest/abd/pel For Dissection W And/or Wo Contrast  Result Date: June 22, 2016 CLINICAL DATA:  Presented with code stroke. Hypertension. Right hip pain and leg weakness. Recent diagnosis of atrial fibrillation. EXAM: CT ANGIOGRAPHY CHEST, ABDOMEN AND PELVIS TECHNIQUE: Multidetector CT imaging through the chest, abdomen and pelvis was performed using the standard protocol during bolus administration of intravenous contrast. Multiplanar reconstructed images and MIPs were obtained and reviewed to evaluate the vascular anatomy. CONTRAST:  100 cc Isovue 370 COMPARISON:  None. FINDINGS: Vascular Findings of the chest: Cardiomegaly. Minimal coronary artery calcifications. Calcifications within the aortic valve leaflets. No pericardial effusion though a small amount of fluid is seen within the pericardial recess. No discrete filling defects are seen within the left ventricle or atrium on this nongated examination. There is mild fusiform aneurysmal dilatation of the ascending thoracic aorta with the ascending thoracic aorta measuring approximately 40 mm in diameter. The thoracic aorta tapers to a normal caliber at the level of the aortic arch. Scattered minimal amount of atherosclerotic plaque within a normal caliber thoracic aorta, not resulting in a hemodynamically significant stenosis. Review of the precontrast images are negative for the presence of an intramural hematoma. No definite thoracic aortic dissection on this nongated examination. Bovine configuration  of the aortic arch. There is non opacification of right subclavian artery (coronal image 100,  series 8), just peripheral to the takeoff of the still patent right IMA (image 23, series 5). The remaining branch vessels of the aortic arch are tortuous though patent throughout their imaged course. Although this examination was not tailored for the evaluation the pulmonary arteries, there are no discrete filling defects within the central pulmonary arterial tree to suggest central pulmonary embolism. Enlarged caliber of the main pulmonary artery measuring 35 mm in diameter. ------------------------------------------------------------- Thoracic aortic measurements: Sinotubular junction 34 mm as measured in greatest oblique coronal dimension. Proximal ascending aorta 40 mm as measured in greatest oblique axial dimension at the level of the main pulmonary artery (image 57, series 5) an approximately 40 mm in greatest oblique coronal diameter (coronal image 74, series 8). Aortic arch aorta 33 mm as measured in greatest oblique sagittal dimension. Proximal descending thoracic aorta 34 mm as measured in greatest oblique axial dimension at the level of the main pulmonary artery. Distal descending thoracic aorta 31 mm as measured in greatest oblique axial dimension at the level of the diaphragmatic hiatus. Review of the MIP images confirms the above findings. ------------------------------------------------------------- Non-Vascular Findings of the chest: Evaluation the pulmonary parenchyma is degraded secondary to patient respiratory artifact. Minimal dependent subpleural ground-glass atelectasis. Mild-to-moderate peripheral and apical predominant centrilobular emphysematous change. Mild diffuse bronchial wall thickening. The central airways remain patent. No discrete focal airspace opacities. No pleural effusion or pneumothorax. No discrete pulmonary nodules given limitation of the examination. Solitary enlarged precarinal lymph node measures 1.5 cm in greatest short axis diameter (image 47, series 5 and is presumably  reactive in etiology. Additional scattered mediastinal lymph nodes are numerous and prominent though individually not enlarged by size criteria with index prevascular lymph node measuring 0.9 cm in diameter (is 45, series 5). No mediastinal, hilar axillary lymphadenopathy. Regional soft tissues appear normal. No acute or aggressive osseous abnormalities within the chest. Stigmata of DISH within the thoracic spine. --------------------------------------------------------------- Vascular Findings of the abdomen and pelvis: Abdominal aorta: There is a moderate to large amount of mixed calcified and noncalcified atherosclerotic plaque within a normal caliber abdominal aorta. There is age indeterminate hypo attenuating thrombus within the aortic bifurcation which occludes the left common iliac artery and results in a focal severe (greater than 90%) luminal narrowing involving the right common iliac artery (representative axial images 21 241, series 5, coronal image 70, series 8). No abdominal aortic dissection or periapical vascular stranding. Celiac artery: There is a minimal amount of eccentric mixed calcified and noncalcified atherosclerotic plaque involving the origin the celiac artery, not resulting in hemodynamically significant stenosis. Conventional branching pattern. The distal tributaries of the celiac artery appear patent without discrete intraluminal filling defect. SMA: There is a minimal amount of mixed calcified and noncalcified atherosclerotic plaque involving the origin proximal aspect of the SMA, not resulting in hemodynamically significant stenosis. Conventional branching pattern. The distal tributaries the SMA are widely patent without discrete intraluminal filling defect suggest distal embolism. Right Renal artery: Solitary; there is a minimal amount of eccentric mixed calcified and noncalcified atherosclerotic plaque within in the right renal artery, not resulting in hemodynamically significant  stenosis. There is a discrete filling defect within a subsegmental cranial division of the right renal artery with associated geographic oligemia involving in the anterior superior aspect of the right kidney (coronal image 88, series 8). Left Renal artery: Solitary; widely patent without hemodynamically significant narrowing. No evidence of distal embolism. IMA:  Age-indeterminate occlusion involving the origin and proximal aspect of the IMA with Loletta Harper reconstitution via collateral supply from the SMA. Right-sided pelvic vasculature: As above, noncalcified thrombus extends from the caudal aspect of the abdominal aorta to result in a focal severe greater than 90%) luminal narrowing involving the origin of the right common iliac artery. The right internal iliac artery is thrombosed at its origin. There is complete occlusion involving the distal aspect of the right external iliac artery (image 273, series 5) extending to involve the imaged course of the right common, deep and superficial femoral arteries. Left-sided pelvic vasculature: As above, mural thrombus extends from the aortic bifurcation to occlude the left common and internal iliac arteries. The left external iliac artery is occluded centrally with reconstitution via collateral supply from both the left inferior epigastric and deep circumflex iliac arteries. The left common, superficial and deep femoral arteries appear patent throughout their imaged course. Review of the MIP images confirms the above findings. -------------------------------------------------------------------------------- Nonvascular Findings of the abdomen and pelvis: Evaluation of the abdominal organs is limited to the arterial phase of enhancement. 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 No discrete hyper enhancing hepatic lesions. Normal Carrolyn Hilmes arterial phase appearance of the gallbladder given underdistention. No radiopaque gallstones. No ascites. Normal Jori Frerichs arterial  phase appearance of the pancreas and spleen. No evidence of splenic infarct. There is geographic oligemia involving the anterior superior aspect of the right kidney worrisome for involving infarct (representative axial image 158, series 5, coronal image 83, series 8). Note is made of an approximately 1.1 cm hypo attenuating (6 Hounsfield unit) right-sided renal cyst. No discrete left-sided renal lesions. No urinary obstruction. There is a minimal amount of asymmetric right-sided perinephric stranding. There is mild diffuse thickening of the bilateral adrenal glands without discrete nodule. Normal appearance of the urinary bladder given degree distention. Moderate colonic stool burden without evidence of enteric obstruction. Scatter minimal colonic diverticulosis without evidence of diverticulitis. The bowel is otherwise normal in course and caliber without wall thickening or evidence of enteric obstruction. Normal appearance of the terminal ileum and appendix. No discrete areas of bowel wall thickening. No pneumoperitoneum, pneumatosis or portal venous gas. No bulky retroperitoneal, mesenteric, pelvic or inguinal lymphadenopathy. Borderline prostatomegaly.  No free fluid in the pelvic cul-de-sac. No acute or aggressive osseous abnormalities. Mild-to-moderate multilevel lumbar spine DDD, worse at L4-L5 with disc space height loss, endplate irregularity and sclerosis. Mild degenerate change of the bilateral hips, right greater than left. Note is made of a large (approximately 5.5 x 6.1 cm right-sided hydrocele. Small left-sided mesenteric fat containing indirect inguinal hernia. IMPRESSION: Vascular Impression of the chest: 1. 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 The additional branch vessels of the aortic arch are tortuous though patent throughout their imaged course. 2. 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 Recommend annual imaging followup by CTA or MRA. This recommendation follows 2010 ACCF/AHA/AATS/ACR/ASA/SCA/SCAI/SIR/STS/SVM Guidelines for the Diagnosis and Management of Patients with Thoracic Aortic Disease. Circulation. 2010; 121: Z610-R604. No evidence of thoracic aortic dissection. 3. Cardiomegaly with calcifications within the aortic valve leaflets, nonspecific though could be seen in the setting of aortic stenosis. Enlarged caliber the main pulmonary artery, nonspecific though could be seen in the setting of pulmonary arterial hypertension. Further evaluation cardiac echo could performed as clinically indicated. 4. Atherosclerosis including coronary artery calcifications. Aortic Atherosclerosis (ICD10-170.0) Nonvascular Impression of the chest: 1.  Mild apical and peripheral/subpleural predominant centrilobular emphysematous change. Emphysema. (ICD10-J43.9) 2. Suspected superimposed airways disease/bronchitis. No focal airspace opacities to suggest pneumonia. _________________________________________________ Vascular Impression of the abdomen and pelvis: 1. Presumably acute intraluminal thrombus within the aortic bifurcation extending to occlude the left common iliac artery and result in a focal severe (> 90%) luminal narrowing of the right common iliac artery. No evidence of abdominal aortic aneurysm or dissection. Given history of recent diagnosis of atrial fibrillation, this thrombus may be of cardiogenic origin. Further evaluation with cardiac echo is recommended. 2. The right common, superficial and deep femoral arteries are occluded throughout their course. Additionally, the right internal iliac artery is occluded. 3. 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 There is reconstitution of the distal aspect of the left external iliac artery via supply from the ipsilateral left inferior epigastric and deep iliac circumflex arteries. The left common, superficial and  deep femoral arteries appear patent. 4. Occlusion of a subsegmental branch of the cranial division of the right renal artery with associated evolving infarct involving the anterior/superior aspect the right kidney. 5. Age-indeterminate occlusion involving the origin and proximal aspect of the IMA with Maia Handa reconstitution via collateral supply from the SMA. Nonvascular Impression of the abdomen and pelvis: 1. No evidence of mesenteric ischemia. Specifically, no discrete areas of bowel wall thickening, pneumatosis or portal venous gas. Critical Value/emergent results were called by telephone at the time of interpretation on Jul 19, 2016 at 1:28 pm to Dr. Raeford Razor , who verbally acknowledged these results. Electronically Signed   By: Simonne Come M.D.   On: 07/19/16 13:37   Ct Head Code Stroke W/o Cm  Result Date: 2016-07-19 CLINICAL DATA:  Code stroke. Sudden onset right lower extremity weakness and numbness below the knee beginning at 11:30 a.m. today. EXAM: CT HEAD WITHOUT CONTRAST TECHNIQUE: Contiguous axial images were obtained from the base of the skull through the vertex without intravenous contrast. COMPARISON:  None. FINDINGS: Moderate periventricular and subcortical white matter hypoattenuation is present bilaterally. This is symmetric. The basal ganglia are intact. Insular ribbon is normal bilaterally. No acute or focal cortical abnormality is present. ACA territories are intact. No acute hemorrhage or mass lesion is present. The ventricles are of normal size. A remote lacunar infarct is present in the right cerebellum. The brainstem and cerebellum are otherwise unremarkable. Mild mucosal thickening is present in the frontal sinuses and anterior ethmoid air cells. The remaining paranasal sinuses and the mastoid air cells are clear. The calvarium is intact. No significant extracranial soft tissue lesion is present. ASPECTS [facility] Stroke Program Jaron Czarnecki CT Score) - Ganglionic level infarction (caudate,  lentiform nuclei, internal capsule, insula, M1-M3 cortex): 7/7 - Supraganglionic infarction (M4-M6 cortex): 3/3 Total score (0-10 with 10 being normal): 10/10 IMPRESSION: 1. No acute intracranial abnormality. 2. Moderate diffuse white matter disease likely reflects the sequela of chronic microvascular ischemia. 3. 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 4. ASPECTS is 10/10 These results were called by telephone at the time of interpretation on 07/19/2016 at 12:47 pm to Dr. Raeford Razor , who verbally acknowledged these results. Electronically Signed   By: Marin Roberts M.D.   On: Jul 19, 2016 12:51   Anti-infectives: Anti-infectives    Start     Dose/Rate Route Frequency Ordered Stop   06/18/2016 0500  vancomycin (VANCOCIN) IVPB 1000 mg/200 mL premix     1,000 mg 200 mL/hr over 60 Minutes Intravenous Every 12 hours 06/12/2016 0114 07/09/2016 0459   07-19-16 1615  vancomycin (VANCOCIN)  1,500 mg in sodium chloride 0.9 % 500 mL IVPB     1,500 mg 250 mL/hr over 120 Minutes Intravenous STAT 06/22/2016 1609 06/23/2016 1703      Assessment/Plan: s/p Procedure(s): THROMBECTOMY OF RIGHT ARM Brachial and subclavian thrombectomy.  Left  iliac  thrombectomy and  right iliac, Femoral and Popliteal  artery thrombectomy. (Bilateral) ANGIOGRAM lower EXTREMITY RIGHT X 2 (Right) Potassium of 7.3 being repeated this morning. Abdominal exam completely benign. Explained to the patient that he does not have adequate flow for limb salvage of the right leg. Will plan on return to the OR for reexploration possible fasciotomy. Explained this all to the patient who understands. This with Dr. Jamison Neighbor with critical care medicine. They will assist with the stabilization. Does not appear to have cardiogenic shock but would expect better flow to his left leg and now has diminished pulse in the left arm compared to preop.   LOS: 1 day   Gretta Began Jul 14, 2016, 6:29 AM

## 2016-06-20 NOTE — Progress Notes (Addendum)
*  PRELIMINARY RESULTS* Vascular Ultrasound Carotid Duplex (Doppler) has been completed.  There is no obvious evidence of hemodynamically significant internal carotid artery stenosis bilaterally. Right vertebral artery is patent with antegrade flow. Unable to visualize the left vertebral artery.  03-Nov-2015 11:27 AM Gertie FeyMichelle Deserae Jennings, BS, RVT, RDCS, RDMS

## 2016-06-20 NOTE — Progress Notes (Signed)
MD Early paged. Awaiting return call.

## 2016-06-20 NOTE — Progress Notes (Signed)
eLink Physician-Brief Progress Note Patient Name: Luis MilesJohn Sellers DOB: June 27, 1941 MRN: [id number]   Date of Service  06/19/2016  HPI/Events of Note  Multiple issues: 1. AFIB with RVR. BP = 130/86. HR = 100's-130's, 2. K+ 7+ on insulin IV infusion with blood glucose = 75. Repeat K+ pending.   eICU Interventions  Will order: 1. Cardizem IV infusion. Titrate to HR 65-115.  2. D5 0.9 NaCl to run IV at 100 mL/hour.  3. Continue Insulin IV infusion.     Intervention Category Major Interventions: Electrolyte abnormality - evaluation and management;Arrhythmia - evaluation and management  Kase Shughart Eugene 07/03/2016, 4:28 PM

## 2016-06-20 NOTE — Progress Notes (Signed)
Paged MD Early regarding patient's critical Potassium of 7.3. Received orders to repeat potassium draw to ensure accuracy. Repeat STAT potassium placed and phlebotomy notified. Will continue to monitor patient closely.  Marlou PorchBradley Kiri Hinderliter

## 2016-06-20 NOTE — Progress Notes (Signed)
PT Cancellation Note  Patient Details Name: Luis MilesJohn Sellers MRN: [id number] DOB: 13-Sep-1941   Cancelled Treatment:    Reason Eval/Treat Not Completed: Patient not medically ready   Plan for return to OR today;   Will follow up postop;   Thank you,  Van ClinesHolly Orazio Weller, PT  Acute Rehabilitation Services Pager [phone] Office [phone]    Van ClinesGarrigan, Verma Grothaus Hamff 20-May-2016, 10:10 AM

## 2016-06-20 NOTE — Progress Notes (Signed)
E-Link MD paged regarding patient's CBG finger-stick of 20. Patient is asymptomatic. Questioning the validity of these results due to patient's vascular status. Received orders to get a STAT BMP. Phlebotomy notified. Will continue to monitor patient closely.   Luis PorchBradley Tanaysha Sellers

## 2016-06-20 NOTE — Progress Notes (Signed)
    Patient seen early this morning for evaluation of elevated troponin.  Troponin was trending down.  Echo pending.  Agree with suggestions on earlier consult.  We will follow.

## 2016-06-20 NOTE — Anesthesia Preprocedure Evaluation (Addendum)
Anesthesia Evaluation  Patient identified by MRN, date of birth, ID band Patient awake    Reviewed: Allergy & Precautions, NPO status , Patient's Chart, lab work & pertinent test results  Airway Mallampati: III  TM Distance: >3 FB Neck ROM: Full    Dental  (+) Missing, Poor Dentition, Partial Upper   Pulmonary former smoker,    breath sounds clear to auscultation       Cardiovascular hypertension, Pt. on medications  Rhythm:Irregular Rate:Tachycardia     Neuro/Psych negative neurological ROS  negative psych ROS   GI/Hepatic negative GI ROS, Neg liver ROS,   Endo/Other  negative endocrine ROS  Renal/GU negative Renal ROS  negative genitourinary   Musculoskeletal negative musculoskeletal ROS (+)   Abdominal (+) + obese,   Peds negative pediatric ROS (+)  Hematology negative hematology ROS (+)   Anesthesia Other Findings   Reproductive/Obstetrics negative OB ROS                          Lab Results  Component Value Date   WBC 17.1 (H) 06/23/2016   HGB 14.5 07/05/2016   HCT 44.3 06/19/2016   MCV 90.4 07/06/2016   PLT 110 (L) 06/29/2016   Lab Results  Component Value Date   CREATININE 1.52 (H) 06/15/2016   BUN 21 (H) 06/23/2016   NA 138 06/14/2016   K 7.0 (HH) 07/04/2016   CL 105 07/07/2016   CO2 18 (L) 07/04/2016   Lab Results  Component Value Date   INR 1.24 07/04/2016   INR 1.15 07/06/2016   INR 1.16 07/29/2011   06/2016 EKG: atrial fibrillation   Anesthesia Physical Anesthesia Plan  ASA: III and emergent  Anesthesia Plan: General   Post-op Pain Management:    Induction: Intravenous, Rapid sequence and Cricoid pressure planned  Airway Management Planned: Oral ETT  Additional Equipment:   Intra-op Plan:   Post-operative Plan: Extubation in OR  Informed Consent: I have reviewed the patients History and Physical, chart, labs and discussed the procedure including  the risks, benefits and alternatives for the proposed anesthesia with the patient or authorized representative who has indicated his/her understanding and acceptance.   Dental advisory given  Plan Discussed with: CRNA  Anesthesia Plan Comments:         Anesthesia Quick Evaluation

## 2016-06-20 NOTE — Progress Notes (Signed)
Pt requested for me to pick-up his cell phone to enable him to lookup his daughter's phone number.  Once I returned and gave him his cell, there were visitors in the room and he requested for me to pickup his $1,487 in cash and give it to one of the visitors, Pitney BowesEddie McAdoo.  I did pickup the cash and verfied the amount of $1,487.00 with Hoover BrownsKristy Snead, RN.  In addition, I asked for the person, Darnelle Catalanddie McAdoo, to recount and verify the amount with Wilkie AyeKristy and I present.

## 2016-06-20 NOTE — Consult Note (Addendum)
Cardiology Consult    Patient ID: Luis Sellers MRN: [id number], DOB/AGE: 1941/06/28   Admit date: 11-Jul-2016 Date of Consult: 06/19/2016  Primary Physician: No PCP Per Patient Reason for Consult: Troponin elevation Primary Cardiologist: n/a Requesting Provider: Gretta Began, MD   History of Present Illness     [name] is a 75 year old male with a significant past medical history of hypertension, prior diverticular gastrointestinal bleeding, and newly diagnosed atrial fibrillation is here for arterial thrombosis at multiple locations with critical limb ischemia.  Cardiology is being consulted for evaluation of an elevated troponin level.    Patient initially was admitted yesterday after developing weakness in his right leg.   His initial work up was significant for an ECG that showed atrial fibrillation, CT body angiography with diffuse thrombosis in multiple locations and MRI of brain showing acute linear infarcts.  He underwent embolectomy of his right femoral artery, left iliac, and right subclavian artery today by vascular surgery yesterday night.    [name] does not report any symptoms at this time.  He does not recall ever having any chest pain or shortness of breath prior to being admitted or during his hospitalization.  His only complaint that made him come to the hospital was the pain in his right leg. Patient does not report any palpitations, lightheadedness, dizziness, or chest pain.  He states that he is feeling well at this time.  He does report having swelling in his legs prior to coming into the hospital for which he states has improved.    Past Medical History   Past Medical History:  Diagnosis Date  . Hypertension    not on any antihypertensives    History reviewed. No pertinent surgical history.   Allergies  Allergies  Allergen Reactions  . Penicillins Rash    Inpatient Medications    .  stroke: mapping our early stages of recovery book   Does not  apply Once  . benazepril  20 mg Oral Daily   And  . hydrochlorothiazide  25 mg Oral Daily  . docusate sodium  100 mg Oral Daily  . fentaNYL      . hydrALAZINE      . mouth rinse  15 mL Mouth Rinse BID  . pantoprazole  40 mg Oral Daily  . vancomycin  1,000 mg Intravenous Q12H    Family History    No family history on file.  Social History    Social History   Social History  . Marital status: Single    Spouse name: N/A  . Number of children: N/A  . Years of education: N/A   Occupational History  . Not on file.   Social History Main Topics  . Smoking status: Former Smoker    Types: Cigarettes  . Smokeless tobacco: Not on file  . Alcohol use Not on file  . Drug use: Unknown  . Sexual activity: Not on file   Other Topics Concern  . Not on file   Social History Narrative  . No narrative on file     Review of Systems    All other systems reviewed and are otherwise negative except as noted above.  Physical Exam    Blood pressure 113/80, pulse (!) 37, temperature 97.5 F (36.4 C), temperature source Oral, resp. rate 15, height 5' 8.9" (1.75 m), weight 124.7 kg (275 lb), SpO2 99 %.  General: Pleasant, NAD Psych: Normal affect. Neuro: Alert and oriented X 3. Moves all  extremities spontaneously. HEENT: Normal  Neck: Supple without bruits or JVD. Lungs:  Resp regular and unlabored, CTA. Heart: Irregular-Irregular, no s3, s4, or murmurs. Abdomen: Soft, non-tender, non-distended, BS + x 4.  Extremities: No clubbing, cyanosis or edema. DP/PT/Radials 2+ and equal bilaterally.  Labs    Troponin [facility] Test)  Recent Labs  2016-06-28 1229  TROPIPOC 0.15*    Recent Labs  2016/06/28 1506 06/29/2016 0415  TROPONINI 5.32* 4.64*   Lab Results  Component Value Date   WBC 17.1 (H) 06/28/2016   HGB 14.5 07/03/2016   HCT 44.3 06/16/2016   MCV 90.4 06/17/2016   PLT PENDING 06/22/2016    Recent Labs Lab 06-28-16 1222  07/04/2016 0415  NA 139  < > 138  K  3.2*  < > 7.3*  CL 110  < > 105  CO2 19*  --  18*  BUN 19  < > 21*  CREATININE 1.11  < > 1.52*  CALCIUM 8.9  --  7.9*  PROT 6.6  --   --   BILITOT 0.8  --   --   ALKPHOS 86  --   --   ALT 14*  --   --   AST 25  --   --   GLUCOSE 195*  < > 147*  < > = values in this interval not displayed. Lab Results  Component Value Date   CHOL 163 06/11/2016   HDL 44 06/26/2016   LDLCALC 110 (H) 06/20/2016   TRIG 43 06/20/2016   No results found for: [facility]   Radiology Studies    [name] Head Wo Contrast  Result Date: 06/28/2016 CLINICAL DATA:  Acute onset of right leg weakness. The examination had to be discontinued prior to completion due to stroke neurologist requested diffusion only. EXAM: MRI HEAD WITHOUT CONTRAST MRA HEAD WITHOUT CONTRAST TECHNIQUE: Multiplanar, multiecho pulse sequences of the brain and surrounding structures were obtained without intravenous contrast. Angiographic images of the head were obtained using MRA technique without contrast. COMPARISON:  CT head without contrast from the same day. FINDINGS: MRI HEAD FINDINGS Linear areas of restricted diffusion are present within the medial cerebellar hemispheres bilaterally, slightly larger on the left. No acute supratentorial infarct is present. 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 This is directed superiorly. The internal carotid arteries are otherwise within normal limits bilaterally. The A1 and M1 segments are normal. The MCA bifurcation is intact bilaterally There is asymmetric attenuation of posterior left MCA branch vessels with a moderate distal left M2 stenosis. The right vertebral artery is the dominant vessel. The left vertebral artery terminates at the PICA. There is a fenestration of the vertebrobasilar junction without aneurysm. The basilar artery  is somewhat small. The right posterior cerebral artery is of fetal type. The left posterior cerebral artery originates from basilar tip. A left posterior communicating artery contributes as well. There is asymmetric attenuation of distal PCA branch vessels, worse on the right. IMPRESSION: 1. 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 2. Small left vertebral artery essentially terminates at the PICA. 3. 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 4. Fetal type right posterior cerebral artery. 5. Moderate stenosis of distal left M2 branch with asymmetric attenuation of distal left M3 branches. 6. 3 mm left peri-ophthalmic artery aneurysm. These results were called by  telephone at the time of interpretation on 06/28/2016 at 1:20 pm to Dr. Ritta Slot , who verbally acknowledged these results. Electronically Signed   By: Marin Roberts M.D.   On: 07/05/2016 14:09   Mr Brain Ltd W/o Cm  Result Date: 06/24/2016 CLINICAL DATA:  Acute onset of right leg weakness. The examination had to be discontinued prior to completion due to stroke neurologist requested diffusion only. EXAM: MRI HEAD WITHOUT CONTRAST MRA HEAD WITHOUT CONTRAST TECHNIQUE: Multiplanar, multiecho pulse sequences of the brain and surrounding structures were obtained without intravenous contrast. Angiographic images of the head were obtained using MRA technique without contrast. COMPARISON:  CT head without contrast from the same day. FINDINGS: MRI HEAD FINDINGS Linear areas of restricted diffusion are present within the medial cerebellar hemispheres bilaterally, slightly larger on the left. No acute supratentorial infarct is present. 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 This is directed superiorly. The internal carotid  arteries are otherwise within normal limits bilaterally. The A1 and M1 segments are normal. The MCA bifurcation is intact bilaterally There is asymmetric attenuation of posterior left MCA branch vessels with a moderate distal left M2 stenosis. The right vertebral artery is the dominant vessel. The left vertebral artery terminates at the PICA. There is a fenestration of the vertebrobasilar junction without aneurysm. The basilar artery is somewhat small. The right posterior cerebral artery is of fetal type. The left posterior cerebral artery originates from basilar tip. A left posterior communicating artery contributes as well. There is asymmetric attenuation of distal PCA branch vessels, worse on the right. IMPRESSION: 1. 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 2. Small left vertebral artery essentially terminates at the PICA. 3. 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 4. Fetal type right posterior cerebral artery. 5. Moderate stenosis of distal left M2 branch with asymmetric attenuation of distal left M3 branches. 6. 3 mm left peri-ophthalmic artery aneurysm. These results were called by telephone at the time of interpretation on 07/09/2016 at 1:20 pm to Dr. Ritta Slot , who verbally acknowledged these results. Electronically Signed   By: Marin Roberts M.D.   On: 06/15/2016 14:09   Ct Angio Chest/abd/pel For Dissection W And/or Wo Contrast  Result Date: 06/16/2016 CLINICAL DATA:  Presented with code stroke. Hypertension. Right hip pain and leg weakness. Recent diagnosis of atrial fibrillation. EXAM: CT ANGIOGRAPHY CHEST, ABDOMEN AND PELVIS TECHNIQUE: Multidetector CT imaging through the chest, abdomen and pelvis was performed using the standard protocol during bolus administration of intravenous contrast. Multiplanar reconstructed images and MIPs were obtained and reviewed to evaluate the vascular anatomy. CONTRAST:  100 cc Isovue 370 COMPARISON:  None.  FINDINGS: Vascular Findings of the chest: Cardiomegaly. Minimal coronary artery calcifications. Calcifications within the aortic valve leaflets. No pericardial effusion though a small amount of fluid is seen within the pericardial recess. No discrete filling defects are seen within the left ventricle or atrium on this nongated examination. There is mild fusiform aneurysmal dilatation of the ascending thoracic aorta with the ascending thoracic aorta measuring approximately 40 mm in diameter. The thoracic aorta tapers to a normal caliber at the level of the aortic arch. Scattered minimal amount of atherosclerotic plaque within a normal caliber thoracic aorta, not resulting in a hemodynamically significant stenosis. Review of the precontrast images are negative for the presence of an intramural  hematoma. No definite thoracic aortic dissection on this nongated examination. Bovine configuration of the aortic arch. There is non opacification of right subclavian artery (coronal image 100, series 8), just peripheral to the takeoff of the still patent right IMA (image 23, series 5). The remaining branch vessels of the aortic arch are tortuous though patent throughout their imaged course. Although this examination was not tailored for the evaluation the pulmonary arteries, there are no discrete filling defects within the central pulmonary arterial tree to suggest central pulmonary embolism. Enlarged caliber of the main pulmonary artery measuring 35 mm in diameter. ------------------------------------------------------------- Thoracic aortic measurements: Sinotubular junction 34 mm as measured in greatest oblique coronal dimension. Proximal ascending aorta 40 mm as measured in greatest oblique axial dimension at the level of the main pulmonary artery (image 57, series 5) an approximately 40 mm in greatest oblique coronal diameter (coronal image 74, series 8). Aortic arch aorta 33 mm as measured in greatest oblique sagittal  dimension. Proximal descending thoracic aorta 34 mm as measured in greatest oblique axial dimension at the level of the main pulmonary artery. Distal descending thoracic aorta 31 mm as measured in greatest oblique axial dimension at the level of the diaphragmatic hiatus. Review of the MIP images confirms the above findings. ------------------------------------------------------------- Non-Vascular Findings of the chest: Evaluation the pulmonary parenchyma is degraded secondary to patient respiratory artifact. Minimal dependent subpleural ground-glass atelectasis. Mild-to-moderate peripheral and apical predominant centrilobular emphysematous change. Mild diffuse bronchial wall thickening. The central airways remain patent. No discrete focal airspace opacities. No pleural effusion or pneumothorax. No discrete pulmonary nodules given limitation of the examination. Solitary enlarged precarinal lymph node measures 1.5 cm in greatest short axis diameter (image 47, series 5 and is presumably reactive in etiology. Additional scattered mediastinal lymph nodes are numerous and prominent though individually not enlarged by size criteria with index prevascular lymph node measuring 0.9 cm in diameter (is 45, series 5). No mediastinal, hilar axillary lymphadenopathy. Regional soft tissues appear normal. No acute or aggressive osseous abnormalities within the chest. Stigmata of DISH within the thoracic spine. --------------------------------------------------------------- Vascular Findings of the abdomen and pelvis: Abdominal aorta: There is a moderate to large amount of mixed calcified and noncalcified atherosclerotic plaque within a normal caliber abdominal aorta. There is age indeterminate hypo attenuating thrombus within the aortic bifurcation which occludes the left common iliac artery and results in a focal severe (greater than 90%) luminal narrowing involving the right common iliac artery (representative axial images 21  241, series 5, coronal image 70, series 8). No abdominal aortic dissection or periapical vascular stranding. Celiac artery: There is a minimal amount of eccentric mixed calcified and noncalcified atherosclerotic plaque involving the origin the celiac artery, not resulting in hemodynamically significant stenosis. Conventional branching pattern. The distal tributaries of the celiac artery appear patent without discrete intraluminal filling defect. SMA: There is a minimal amount of mixed calcified and noncalcified atherosclerotic plaque involving the origin proximal aspect of the SMA, not resulting in hemodynamically significant stenosis. Conventional branching pattern. The distal tributaries the SMA are widely patent without discrete intraluminal filling defect suggest distal embolism. Right Renal artery: Solitary; there is a minimal amount of eccentric mixed calcified and noncalcified atherosclerotic plaque within in the right renal artery, not resulting in hemodynamically significant stenosis. There is a discrete filling defect within a subsegmental cranial division of the right renal artery with associated geographic oligemia involving in the anterior superior aspect of the right kidney (coronal image 88, series 8). Left Renal artery: Solitary; widely  patent without hemodynamically significant narrowing. No evidence of distal embolism. IMA: Age-indeterminate occlusion involving the origin and proximal aspect of the IMA with early reconstitution via collateral supply from the SMA. Right-sided pelvic vasculature: As above, noncalcified thrombus extends from the caudal aspect of the abdominal aorta to result in a focal severe greater than 90%) luminal narrowing involving the origin of the right common iliac artery. The right internal iliac artery is thrombosed at its origin. There is complete occlusion involving the distal aspect of the right external iliac artery (image 273, series 5) extending to involve the imaged  course of the right common, deep and superficial femoral arteries. Left-sided pelvic vasculature: As above, mural thrombus extends from the aortic bifurcation to occlude the left common and internal iliac arteries. The left external iliac artery is occluded centrally with reconstitution via collateral supply from both the left inferior epigastric and deep circumflex iliac arteries. The left common, superficial and deep femoral arteries appear patent throughout their imaged course. Review of the MIP images confirms the above findings. -------------------------------------------------------------------------------- Nonvascular Findings of the abdomen and pelvis: Evaluation of the abdominal organs is limited to the arterial phase of enhancement. 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 No discrete hyper enhancing hepatic lesions. Normal early arterial phase appearance of the gallbladder given underdistention. No radiopaque gallstones. No ascites. Normal early arterial phase appearance of the pancreas and spleen. No evidence of splenic infarct. There is geographic oligemia involving the anterior superior aspect of the right kidney worrisome for involving infarct (representative axial image 158, series 5, coronal image 83, series 8). Note is made of an approximately 1.1 cm hypo attenuating (6 Hounsfield unit) right-sided renal cyst. No discrete left-sided renal lesions. No urinary obstruction. There is a minimal amount of asymmetric right-sided perinephric stranding. There is mild diffuse thickening of the bilateral adrenal glands without discrete nodule. Normal appearance of the urinary bladder given degree distention. Moderate colonic stool burden without evidence of enteric obstruction. Scatter minimal colonic diverticulosis without evidence of diverticulitis. The bowel is otherwise normal in course and caliber without wall thickening or evidence of enteric obstruction. Normal appearance of the  terminal ileum and appendix. No discrete areas of bowel wall thickening. No pneumoperitoneum, pneumatosis or portal venous gas. No bulky retroperitoneal, mesenteric, pelvic or inguinal lymphadenopathy. Borderline prostatomegaly.  No free fluid in the pelvic cul-de-sac. No acute or aggressive osseous abnormalities. Mild-to-moderate multilevel lumbar spine DDD, worse at L4-L5 with disc space height loss, endplate irregularity and sclerosis. Mild degenerate change of the bilateral hips, right greater than left. Note is made of a large (approximately 5.5 x 6.1 cm right-sided hydrocele. Small left-sided mesenteric fat containing indirect inguinal hernia. IMPRESSION: Vascular Impression of the chest: 1. 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 The additional branch vessels of the aortic arch are tortuous though patent throughout their imaged course. 2. 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 Recommend annual imaging followup by CTA or MRA. This recommendation follows 2010 ACCF/AHA/AATS/ACR/ASA/SCA/SCAI/SIR/STS/SVM Guidelines for the Diagnosis and Management of Patients with Thoracic Aortic Disease. Circulation. 2010; 121: Z610-R604. No evidence of thoracic aortic dissection. 3. Cardiomegaly with calcifications within the aortic valve leaflets, nonspecific though could be seen in the setting of aortic stenosis. Enlarged caliber the main pulmonary artery, nonspecific though could be seen in the setting of pulmonary arterial hypertension. Further evaluation cardiac echo could performed as clinically indicated. 4. Atherosclerosis including coronary  artery calcifications. Aortic Atherosclerosis (ICD10-170.0) Nonvascular Impression of the chest: 1. Mild apical and peripheral/subpleural predominant centrilobular emphysematous change. Emphysema. (ICD10-J43.9) 2. Suspected superimposed airways  disease/bronchitis. No focal airspace opacities to suggest pneumonia. _________________________________________________ Vascular Impression of the abdomen and pelvis: 1. Presumably acute intraluminal thrombus within the aortic bifurcation extending to occlude the left common iliac artery and result in a focal severe (> 90%) luminal narrowing of the right common iliac artery. No evidence of abdominal aortic aneurysm or dissection. Given history of recent diagnosis of atrial fibrillation, this thrombus may be of cardiogenic origin. Further evaluation with cardiac echo is recommended. 2. The right common, superficial and deep femoral arteries are occluded throughout their course. Additionally, the right internal iliac artery is occluded. 3. 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 There is reconstitution of the distal aspect of the left external iliac artery via supply from the ipsilateral left inferior epigastric and deep iliac circumflex arteries. The left common, superficial and deep femoral arteries appear patent. 4. Occlusion of a subsegmental branch of the cranial division of the right renal artery with associated evolving infarct involving the anterior/superior aspect the right kidney. 5. Age-indeterminate occlusion involving the origin and proximal aspect of the IMA with early reconstitution via collateral supply from the SMA. Nonvascular Impression of the abdomen and pelvis: 1. No evidence of mesenteric ischemia. Specifically, no discrete areas of bowel wall thickening, pneumatosis or portal venous gas. Critical Value/emergent results were called by telephone at the time of interpretation on 07-04-16 at 1:28 pm to Dr. Raeford Razor , who verbally acknowledged these results. Electronically Signed   By: Simonne Come M.D.   On: 07/04/16 13:37   Ct Head Code Stroke W/o Cm  Result Date: 07/04/2016 CLINICAL DATA:  Code stroke. Sudden onset right lower extremity weakness and numbness below the knee  beginning at 11:30 a.m. today. EXAM: CT HEAD WITHOUT CONTRAST TECHNIQUE: Contiguous axial images were obtained from the base of the skull through the vertex without intravenous contrast. COMPARISON:  None. FINDINGS: Moderate periventricular and subcortical white matter hypoattenuation is present bilaterally. This is symmetric. The basal ganglia are intact. Insular ribbon is normal bilaterally. No acute or focal cortical abnormality is present. ACA territories are intact. No acute hemorrhage or mass lesion is present. The ventricles are of normal size. A remote lacunar infarct is present in the right cerebellum. The brainstem and cerebellum are otherwise unremarkable. Mild mucosal thickening is present in the frontal sinuses and anterior ethmoid air cells. The remaining paranasal sinuses and the mastoid air cells are clear. The calvarium is intact. No significant extracranial soft tissue lesion is present. ASPECTS [facility] Stroke Program Early CT Score) - Ganglionic level infarction (caudate, lentiform nuclei, internal capsule, insula, M1-M3 cortex): 7/7 - Supraganglionic infarction (M4-M6 cortex): 3/3 Total score (0-10 with 10 being normal): 10/10 IMPRESSION: 1. No acute intracranial abnormality. 2. Moderate diffuse white matter disease likely reflects the sequela of chronic microvascular ischemia. 3. 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 4. ASPECTS is 10/10 These results were called by telephone at the time of interpretation on 07/04/16 at 12:47 pm to Dr. Raeford Razor , who verbally acknowledged these results. Electronically Signed   By: Marin Roberts M.D.   On: July 04, 2016 12:51    EKG & Cardiac Imaging    EKG:  2016/07/04 (12:04 hrs):  Atiral fibrillation with rapid ventricular rate, anterior infarct age undetermined  Echocardiogram: Pending  Assessment & Plan  Patient with an elevated troponin level of unknown etiology in  the setting of no symptoms concerning for an acute myocardial  infarction and recent surgery.  Patient could be suffering from a type II myocardial infarction (demand ischemia) in the setting of a recent surgery.  Patient could be suffering from a type I myocardial infarction with coronary thrombosis, but suspicion is lower in the absence of symptoms.  Low suspicion for non-coronary etiologies such as acute pulmonary embolism since CT angiography did not show a pulmonary embolism.     # Elevated troponin: Unclear etiology at this time, but likely a type II myocardial infarction.  Patient is without any specific symptoms at this time.  However, patient requires ongoing anticoagulation use in the setting of atrial fibrillation with arterial thrombosis, and he should continue at least 48 hours.  ECG without any specific signs of myocardial ischemia at this time and troponin level is trending downward. Would recommend starting at least an aspirin, but this should be discussed with neurology since his chance of having a post stroke intracerebral hemorrhage is at least intermediate risk. - Continue heparin. - At least start aspirin 81 mg/daily. - Due to concern for intracerebral hemorrhage - Agree with getting echocardiogram. - Once patient is stable, can consider SPECT stress test.    # Vascular thrombosis:   Likely related to his atrial fibrillation with likely thrombosis source being intracardiac.  He has undergone embolectomy and currently being managed by vascular surgery. - Management per vascular surgery.    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  His heart rate at this time is controlled at this time without any rate controlling medications.  Patient has a CHADS2 VASc score of 6, and should be on life-long anticoagulation.  TSH was within normal limits. - Recommend rate control strategy at this time.  Can start on metoprolol 25 mg/daily.  Patient can be cardioverted at a later time  if he would like, but should be done when he is more stable. - Agree with getting echocardiogram.   - Once it is safe to do so, patient should transition to an oral anticoagulant.    # Hypertension: Patient with known essential hypertension.  In the setting of a stroke, would recommend some degree of permissive hypertension.  He take Lotensin at home. - Would not recommend restarting home medication due to his acute kidney injury and hyperkalemia at this time. - Blood pressure has been low, and would not recommend starting any antihypertension medications at this time.     Signed, Judie GrieveKamal D Henderson, MD 06/11/2016, 5:30 AM

## 2016-06-20 NOTE — Anesthesia Postprocedure Evaluation (Signed)
Anesthesia Post Note  Patient: Luis MilesJohn Sellers  Procedure(s) Performed: Procedure(s) (LRB): Exploritory Tibo-peroneal artery, Thrombectomy  peroneal  artery with vein patch angioplasy. (Right) FASCIECTOMY four compartment. (Right)  Patient location during evaluation: PACU Anesthesia Type: General Level of consciousness: awake and alert Pain management: pain level controlled Vital Signs Assessment: post-procedure vital signs reviewed and stable Respiratory status: spontaneous breathing, nonlabored ventilation, respiratory function stable and patient connected to nasal cannula oxygen Cardiovascular status: blood pressure returned to baseline and stable Postop Assessment: no signs of nausea or vomiting Anesthetic complications: no Comments: Continuing insulin gtt to aid in K+ reduction, checking BG regularly and addressing appropriately.     Last Vitals:  Vitals:   06/13/2016 1415 07/08/2016 1430  BP: 132/73 120/81  Pulse: (!) 53   Resp: 14 13  Temp:      Last Pain:  Vitals:   07/09/2016 0830  TempSrc:   PainSc: 6                  Shelton SilvasKevin D Wanetta Funderburke

## 2016-06-20 NOTE — Progress Notes (Signed)
CRITICAL VALUE ALERT  Critical value received:  Potassium 7.6   Date of notification:  Aug 07, 2016  Time of notification:  0500  Critical value read back: Yes  Nurse who received alert:  Feliberto GottronBrad Danasia Baker RN  MD notified (1st page):  Gretta Beganodd Early MD  Time of first page:  0510  MD notified (2nd page):  Time of second page:  Responding MD:  Gretta Beganodd Early MD  Time MD responded:  [phone]

## 2016-06-20 NOTE — Progress Notes (Signed)
Elink notified that pt's HR is having runs t the 130s-1402 in afib and is maintaining in the upper 1teens to 120s otherwise.  Pt currently on an insulin drip at 4 u/hr which was initiated in the OR to decrease potassium level.  Recommended that drip be discontinued.  Pt continues to have scant urine output.  Pt had received a total of 2L of boluses since this AM.  Bladder scan of pt was 73ml.  Elink advised.    Phlebotomy was notified of missed BMP which was scheduled for 1300 and that pt has returned to enable potassium level to be rechecked.  Phlebotomy at bedside drawing BMP.

## 2016-06-20 NOTE — Anesthesia Procedure Notes (Signed)
Procedure Name: Intubation Date/Time: 10-26-2015 11:04 AM Performed by: Alanda AmassFRIEDMAN, Roselinda Bahena A Pre-anesthesia Checklist: Patient identified, Emergency Drugs available, Suction available, Patient being monitored and Timeout performed Patient Re-evaluated:Patient Re-evaluated prior to inductionOxygen Delivery Method: Circle System Utilized and Circle system utilized Preoxygenation: Pre-oxygenation with 100% oxygen Intubation Type: IV induction Ventilation: Mask ventilation without difficulty Laryngoscope Size: Mac and 3 Grade View: Grade I Tube type: Oral Tube size: 7.5 mm Number of attempts: 1 Airway Equipment and Method: Stylet Placement Confirmation: ETT inserted through vocal cords under direct vision,  positive ETCO2 and breath sounds checked- equal and bilateral Secured at: 23 cm Tube secured with: Tape Dental Injury: Teeth and Oropharynx as per pre-operative assessment

## 2016-06-20 NOTE — Progress Notes (Addendum)
Subjective:  [name] was seen and evaluated today at bedside. He is 1 day s/p thrombectomy and 3 limb salvage. He was in no acute distress. He reports his surgery went well yesterday however still does not have sensation in BL LE. He is also with a new complaint of L UE numbness as well.   Objective:  Vital signs in last 24 hours: Vitals:   06/27/2016 0500 07/08/2016 0600 06/13/2016 0700 06/14/2016 0753  BP: 119/85 (!) 144/117 (!) 145/91   Pulse:  (!) 25 (!) 28   Resp: 16 17 17    Temp:    98.2 F (36.8 C)  TempSrc:    Oral  SpO2:  100% 100%   Weight:      Height:       Scheduled Medications .  stroke: mapping our early stages of recovery book   Does not apply Once  . aspirin EC  81 mg Oral Daily  . calcium gluconate  1 g Intravenous Once  . fentaNYL      . hydrALAZINE      . insulin aspart  0-15 Units Subcutaneous Q4H  . mouth rinse  15 mL Mouth Rinse BID  . sodium chloride  1,000 mL Intravenous Once  . vancomycin  1,000 mg Intravenous Q12H   Physical Exam  Constitutional: He is well-developed, well-nourished, and in no distress.  Obese african [place] male resting comfortably in bed. On oxygen.  HENT:  Head: Normocephalic and atraumatic.  Cardiovascular: Exam reveals no gallop and no friction rub.   No murmur heard. Irregularly irregular. Clean dry bandages in place over right chest wall.  Diminished distal pulses of all 4 extremities.  LUE cold to touch. Does not appear dusky at this time. RUE warmer but without strong distal pulse BL LE warmer than prior exam. Without strong pulses BL. LLE appears less dusky than prior exam. RLE with edema.    Pulmonary/Chest: Breath sounds normal. No respiratory distress.  Abdominal: Soft. Bowel sounds are normal. He exhibits no distension. There is no tenderness.  Skin: He is not diaphoretic.    Assessment/Plan:  Active Problems:   Arterial thromboembolism (HCC)   Limb ischemia   Atrial fibrillation (HCC)   HTN  (hypertension)   Thrombus  Arterial Thromboembolism with 4 Limb Critical Ischemia Patient taken to OR yesterday for 3 limb critical ischemia involving BL LE and R UE. No apparent surgical complications from procedure. Patient still denies any feeling in his BL LE. Patient now complains of new L UE numbness.  -Patient currently in OR for further limb salvage attempts. Will follow his progress with this -Lactic acid 5.6 this morning. Patient is thought to likely have some muscle necrosis secondary to ischemia. Will be checking a CK level and administering fluids accordingly.  -Vascular on -Cardiology on  Atrial Fibrillation New diagnosis for patient although he endorses several months of palpitations. Patient was started on Heparin ggt as he needs multiple interventions for his extensive thrombosis -CHADS2VASc score of 6 indicating need for life-long anticoagulation.  -TSH normal -Cardiology recommends rate control with metoprolol 25 mg/daily and pt could be cardioverted at a later time when more stable -Continue heparin ggt  Elevated Troponin This is thought to be due to patients diffuse ischemic process. No signs of myocardial ischemia at this time. Patient without chest pain and EKG without ST segment changes. Troponin trending down at 4.64. Could consider outpatient stress test once stable  Hypertension Known long history of essential HTN. Was on  Lotensin at home however has been trying to manage with diet alone for several years. -Currently allowing permissive HTN in the setting of the brain ischemia detected by MRI  Hyperkalemia Patient developed hyperkalemia of 7.3 overnight, thought to be secondary to ischemia with secondary muscle necrosis. PCCM was subsequently consulted who repeated the potassium which showed a result of 7.0. 30 gm rectal kayexalate was administered at 7am.  We have ordered an EKG, 1 g Calcium Gluconate and 1L ns bolus + 125 ml/hr once completed. Will re-evaluate  potassium level in 4 hours. Will monitor -EKG -1g calcium gluconate -1L NS bolus + 125 ml/hr once completed -Repeat BMET in 4 hrs  Dispo: Anticipated discharge in approximately 3-4 day(s).   Luis Mastrogiovanni, DO 07-05-2016, 9:53 AM Pager: [phone]

## 2016-06-20 NOTE — Progress Notes (Signed)
CRITICAL VALUE ALERT  Critical value received:  Lactic Acid 8.7  Date of notification:  2016-04-03  Time of notification:  1940  Critical value read back: Yes  Nurse who received alert:  Feliberto GottronBrad Madoc Holquin RN  MD notified (1st page):  Allena KatzPatel MD  Time of first page:  1945  MD notified (2nd page):  Time of second page:  Responding MD: Allena KatzPatel MD  Time MD responded:  [phone]

## 2016-06-20 NOTE — Consult Note (Signed)
PULMONARY / CRITICAL CARE MEDICINE   Name: Luis Sellers MRN: [id number] DOB: 1941-08-08    ADMISSION DATE:  30-Jun-2016 CONSULTATION DATE: 9/10  REFERRING MD: Early  CHIEF COMPLAINT:  Multiple emboli  HISTORY OF PRESENT ILLNESS:   75 yo male who was in his usual stat of health till 9/9 when his right leg stopped working and he fell. He was admitted as presumed stroke but ct's revealed multiple clots/occlusions and Afib. He was taken  to OR for embolectomies and will need to return to OR 9/10. Cardiology has seen him for new onset of A fib(possible cause of emboli). PCCM asked to assist due to K+ >7.0. 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 9/10 to return to OR.   PAST MEDICAL HISTORY :  He  has a past medical history of Hypertension.  PAST SURGICAL HISTORY: He  has a past surgical history that includes Eye surgery and Nasal sinus surgery.  Allergies  Allergen Reactions  . Penicillins Rash    No current facility-administered medications on file prior to encounter.    Current Outpatient Prescriptions on File Prior to Encounter  Medication Sig  . benazepril-hydrochlorthiazide (LOTENSIN HCT) 20-25 MG per tablet Take 1 tablet by mouth daily.  Marland Kitchen omeprazole (PRILOSEC) 20 MG capsule Take 20 mg by mouth daily.      FAMILY HISTORY:  His has no family status information on file.    SOCIAL HISTORY: He  reports that he has quit smoking. His smoking use included Cigarettes. He does not have any smokeless tobacco history on file.  REVIEW OF SYSTEMS:   10 point review of system taken, please see HPI for positives and negatives.   SUBJECTIVE:  NAD at rest  VITAL SIGNS: BP (!)  145/91   Pulse (!) 28   Temp 97.5 F (36.4 C) (Oral)   Resp 17   Ht 5' 8.9" (1.75 m)   Wt 275 lb (124.7 kg)   SpO2 100%   BMI 40.73 kg/m   HEMODYNAMICS:    VENTILATOR SETTINGS:    INTAKE / OUTPUT: I/O last 3 completed shifts: In: 3650 [I.V.:3450; IV Piggyback:200] Out: 2125 [Urine:1825; Blood:300]  PHYSICAL EXAMINATION: General:  Obese AAM NAD at rest Neuro: Follows command by [employer]. No sensation from from rt knee to foot HEENT: PERL 4 mm Cardiovascular: HSIR IR Lungs: Decreased in bases Abdomen: Obese +bs Musculoskeletal:  intact Skin:  Rt [place] , rt arm, rt lower ext dressing intact  LABS:  BMET  Recent Labs Lab 2016-06-30 1222 30-Jun-2016 1230 06/27/2016 0415  NA 139 143 138  K 3.2* 3.2* 7.3*  CL 110 106 105  CO2 19*  --  18*  BUN 19 21* 21*  CREATININE 1.11 1.10 1.52*  GLUCOSE 195* 194* 147*    Electrolytes  Recent Labs Lab 06-30-16 1222 07/04/2016 0415  CALCIUM 8.9 7.9*    CBC  Recent Labs Lab 06/30/16 1222 06/30/2016 1230 06/29/2016 0415  WBC 10.1  --  17.1*  HGB 14.4 15.6 14.5  HCT 45.2 46.0 44.3  PLT 173  --  110*    Coag's  Recent Labs Lab 06-30-16 1222  APTT 27  INR 1.15    Sepsis Markers No results for input(s): LATICACIDVEN, PROCALCITON, O2SATVEN in the last 168 hours.  ABG  No results for input(s): PHART, PCO2ART, PO2ART in the last 168 hours.  Liver Enzymes  Recent Labs Lab 07/09/2016 1222  AST 25  ALT 14*  ALKPHOS 86  BILITOT 0.8  ALBUMIN 3.5    Cardiac Enzymes  Recent Labs Lab 06/11/2016 1506 06/19/2016 0415  TROPONINI 5.32* 4.64*    Glucose No results for input(s): GLUCAP in the last 168 hours.  Imaging Mr Shirlee Latch ZO Contrast  Result Date: 07/07/2016 CLINICAL DATA:  Acute onset of right leg weakness. The examination had to be discontinued prior to completion due to stroke neurologist requested diffusion only. EXAM: MRI HEAD WITHOUT CONTRAST MRA HEAD WITHOUT CONTRAST TECHNIQUE: Multiplanar, multiecho pulse  sequences of the brain and surrounding structures were obtained without intravenous contrast. Angiographic images of the head were obtained using MRA technique without contrast. COMPARISON:  CT head without contrast from the same day. FINDINGS: MRI HEAD FINDINGS Linear areas of restricted diffusion are present within the medial cerebellar hemispheres bilaterally, slightly larger on the left. No acute supratentorial infarct is present. 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 This is directed superiorly. The internal carotid arteries are otherwise within normal limits bilaterally. The A1 and M1 segments are normal. The MCA bifurcation is intact bilaterally There is asymmetric attenuation of posterior left MCA branch vessels with a moderate distal left M2 stenosis. The right vertebral artery is the dominant vessel. The left vertebral artery terminates at the PICA. There is a fenestration of the vertebrobasilar junction without aneurysm. The basilar artery is somewhat small. The right posterior cerebral artery is of fetal type. The left posterior cerebral artery originates from basilar tip. A left posterior communicating artery contributes as well. There is asymmetric attenuation of distal PCA branch vessels, worse on the right. IMPRESSION: 1. 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 2. Small left vertebral artery essentially terminates at the PICA. 3. 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 4. Fetal type right posterior cerebral artery. 5. Moderate stenosis of distal left M2 branch with asymmetric attenuation of distal left M3 branches. 6. 3 mm left peri-ophthalmic artery aneurysm. These results were called by telephone at the time of interpretation on 07/06/2016 at 1:20 pm to Dr. Ritta Slot  , who verbally acknowledged these results. Electronically Signed   By: Marin Roberts M.D.   On: 07/06/2016 14:09   Mr Brain Ltd W/o Cm  Result Date: 06/24/2016 CLINICAL DATA:  Acute onset of right leg weakness. The examination had to be discontinued prior to completion due to stroke neurologist requested diffusion only. EXAM: MRI HEAD WITHOUT CONTRAST MRA HEAD WITHOUT CONTRAST TECHNIQUE: Multiplanar, multiecho pulse sequences of the brain and surrounding structures were obtained without intravenous contrast. Angiographic images of the head were obtained using MRA technique without contrast. COMPARISON:  CT head without contrast from the same day. FINDINGS: MRI HEAD FINDINGS Linear areas of restricted diffusion are present within the medial cerebellar hemispheres bilaterally, slightly larger on the left. No acute supratentorial infarct is present. 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 This is directed superiorly. The internal carotid arteries are otherwise within normal limits bilaterally. The A1 and M1 segments are normal. The MCA bifurcation  is intact bilaterally There is asymmetric attenuation of posterior left MCA branch vessels with a moderate distal left M2 stenosis. The right vertebral artery is the dominant vessel. The left vertebral artery terminates at the PICA. There is a fenestration of the vertebrobasilar junction without aneurysm. The basilar artery is somewhat small. The right posterior cerebral artery is of fetal type. The left posterior cerebral artery originates from basilar tip. A left posterior communicating artery contributes as well. There is asymmetric attenuation of distal PCA branch vessels, worse on the right. IMPRESSION: 1. 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 2. Small left  vertebral artery essentially terminates at the PICA. 3. 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 4. Fetal type right posterior cerebral artery. 5. Moderate stenosis of distal left M2 branch with asymmetric attenuation of distal left M3 branches. 6. 3 mm left peri-ophthalmic artery aneurysm. These results were called by telephone at the time of interpretation on 07/05/2016 at 1:20 pm to Dr. Ritta Slot , who verbally acknowledged these results. Electronically Signed   By: Marin Roberts M.D.   On: 07/03/2016 14:09   Ct Angio Chest/abd/pel For Dissection W And/or Wo Contrast  Result Date: 06/14/2016 CLINICAL DATA:  Presented with code stroke. Hypertension. Right hip pain and leg weakness. Recent diagnosis of atrial fibrillation. EXAM: CT ANGIOGRAPHY CHEST, ABDOMEN AND PELVIS TECHNIQUE: Multidetector CT imaging through the chest, abdomen and pelvis was performed using the standard protocol during bolus administration of intravenous contrast. Multiplanar reconstructed images and MIPs were obtained and reviewed to evaluate the vascular anatomy. CONTRAST:  100 cc Isovue 370 COMPARISON:  None. FINDINGS: Vascular Findings of the chest: Cardiomegaly. Minimal coronary artery calcifications. Calcifications within the aortic valve leaflets. No pericardial effusion though a small amount of fluid is seen within the pericardial recess. No discrete filling defects are seen within the left ventricle or atrium on this nongated examination. There is mild fusiform aneurysmal dilatation of the ascending thoracic aorta with the ascending thoracic aorta measuring approximately 40 mm in diameter. The thoracic aorta tapers to a normal caliber at the level of the aortic arch. Scattered minimal amount of atherosclerotic plaque within a normal caliber thoracic aorta, not resulting in a hemodynamically significant stenosis. Review of the precontrast images are negative for the presence of an intramural  hematoma. No definite thoracic aortic dissection on this nongated examination. Bovine configuration of the aortic arch. There is non opacification of right subclavian artery (coronal image 100, series 8), just peripheral to the takeoff of the still patent right IMA (image 23, series 5). The remaining branch vessels of the aortic arch are tortuous though patent throughout their imaged course. Although this examination was not tailored for the evaluation the pulmonary arteries, there are no discrete filling defects within the central pulmonary arterial tree to suggest central pulmonary embolism. Enlarged caliber of the main pulmonary artery measuring 35 mm in diameter. ------------------------------------------------------------- Thoracic aortic measurements: Sinotubular junction 34 mm as measured in greatest oblique coronal dimension. Proximal ascending aorta 40 mm as measured in greatest oblique axial dimension at the level of the main pulmonary artery (image 57, series 5) an approximately 40 mm in greatest oblique coronal diameter (coronal image 74, series 8). Aortic arch aorta 33 mm as measured in greatest oblique sagittal dimension. Proximal descending thoracic aorta 34 mm as measured in greatest oblique axial dimension at the level of the main pulmonary artery. Distal descending thoracic aorta 31 mm as measured in greatest oblique axial dimension at the level of the diaphragmatic  hiatus. Review of the MIP images confirms the above findings. ------------------------------------------------------------- Non-Vascular Findings of the chest: Evaluation the pulmonary parenchyma is degraded secondary to patient respiratory artifact. Minimal dependent subpleural ground-glass atelectasis. Mild-to-moderate peripheral and apical predominant centrilobular emphysematous change. Mild diffuse bronchial wall thickening. The central airways remain patent. No discrete focal airspace opacities. No pleural effusion or pneumothorax.  No discrete pulmonary nodules given limitation of the examination. Solitary enlarged precarinal lymph node measures 1.5 cm in greatest short axis diameter (image 47, series 5 and is presumably reactive in etiology. Additional scattered mediastinal lymph nodes are numerous and prominent though individually not enlarged by size criteria with index prevascular lymph node measuring 0.9 cm in diameter (is 45, series 5). No mediastinal, hilar axillary lymphadenopathy. Regional soft tissues appear normal. No acute or aggressive osseous abnormalities within the chest. Stigmata of DISH within the thoracic spine. --------------------------------------------------------------- Vascular Findings of the abdomen and pelvis: Abdominal aorta: There is a moderate to large amount of mixed calcified and noncalcified atherosclerotic plaque within a normal caliber abdominal aorta. There is age indeterminate hypo attenuating thrombus within the aortic bifurcation which occludes the left common iliac artery and results in a focal severe (greater than 90%) luminal narrowing involving the right common iliac artery (representative axial images 21 241, series 5, coronal image 70, series 8). No abdominal aortic dissection or periapical vascular stranding. Celiac artery: There is a minimal amount of eccentric mixed calcified and noncalcified atherosclerotic plaque involving the origin the celiac artery, not resulting in hemodynamically significant stenosis. Conventional branching pattern. The distal tributaries of the celiac artery appear patent without discrete intraluminal filling defect. SMA: There is a minimal amount of mixed calcified and noncalcified atherosclerotic plaque involving the origin proximal aspect of the SMA, not resulting in hemodynamically significant stenosis. Conventional branching pattern. The distal tributaries the SMA are widely patent without discrete intraluminal filling defect suggest distal embolism. Right Renal  artery: Solitary; there is a minimal amount of eccentric mixed calcified and noncalcified atherosclerotic plaque within in the right renal artery, not resulting in hemodynamically significant stenosis. There is a discrete filling defect within a subsegmental cranial division of the right renal artery with associated geographic oligemia involving in the anterior superior aspect of the right kidney (coronal image 88, series 8). Left Renal artery: Solitary; widely patent without hemodynamically significant narrowing. No evidence of distal embolism. IMA: Age-indeterminate occlusion involving the origin and proximal aspect of the IMA with early reconstitution via collateral supply from the SMA. Right-sided pelvic vasculature: As above, noncalcified thrombus extends from the caudal aspect of the abdominal aorta to result in a focal severe greater than 90%) luminal narrowing involving the origin of the right common iliac artery. The right internal iliac artery is thrombosed at its origin. There is complete occlusion involving the distal aspect of the right external iliac artery (image 273, series 5) extending to involve the imaged course of the right common, deep and superficial femoral arteries. Left-sided pelvic vasculature: As above, mural thrombus extends from the aortic bifurcation to occlude the left common and internal iliac arteries. The left external iliac artery is occluded centrally with reconstitution via collateral supply from both the left inferior epigastric and deep circumflex iliac arteries. The left common, superficial and deep femoral arteries appear patent throughout their imaged course. Review of the MIP images confirms the above findings. -------------------------------------------------------------------------------- Nonvascular Findings of the abdomen and pelvis: Evaluation of the abdominal organs is limited to the arterial phase of enhancement. Nodularity hepatic contour suggestive of hepatic  cirrhosis. No discrete hyper enhancing hepatic lesions. Normal early arterial phase appearance of the gallbladder given underdistention. No radiopaque gallstones. No ascites. Normal early arterial phase appearance of the pancreas and spleen. No evidence of splenic infarct. There is geographic oligemia involving the anterior superior aspect of the right kidney worrisome for involving infarct (representative axial image 158, series 5, coronal image 83, series 8). Note is made of an approximately 1.1 cm hypo attenuating (6 Hounsfield unit) right-sided renal cyst. No discrete left-sided renal lesions. No urinary obstruction. There is a minimal amount of asymmetric right-sided perinephric stranding. There is mild diffuse thickening of the bilateral adrenal glands without discrete nodule. Normal appearance of the urinary bladder given degree distention. Moderate colonic stool burden without evidence of enteric obstruction. Scatter minimal colonic diverticulosis without evidence of diverticulitis. The bowel is otherwise normal in course and caliber without wall thickening or evidence of enteric obstruction. Normal appearance of the terminal ileum and appendix. No discrete areas of bowel wall thickening. No pneumoperitoneum, pneumatosis or portal venous gas. No bulky retroperitoneal, mesenteric, pelvic or inguinal lymphadenopathy. Borderline prostatomegaly.  No free fluid in the pelvic cul-de-sac. No acute or aggressive osseous abnormalities. Mild-to-moderate multilevel lumbar spine DDD, worse at L4-L5 with disc space height loss, endplate irregularity and sclerosis. Mild degenerate change of the bilateral hips, right greater than left. Note is made of a large (approximately 5.5 x 6.1 cm right-sided hydrocele. Small left-sided mesenteric fat containing indirect inguinal hernia. IMPRESSION: Vascular Impression of the chest: 1. 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 The additional branch vessels of the aortic arch are tortuous though patent throughout their imaged course. 2. 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 Recommend annual imaging followup by CTA or MRA. This recommendation follows 2010 ACCF/AHA/AATS/ACR/ASA/SCA/SCAI/SIR/STS/SVM Guidelines for the Diagnosis and Management of Patients with Thoracic Aortic Disease. Circulation. 2010; 121: B147-W295. No evidence of thoracic aortic dissection. 3. Cardiomegaly with calcifications within the aortic valve leaflets, nonspecific though could be seen in the setting of aortic stenosis. Enlarged caliber the main pulmonary artery, nonspecific though could be seen in the setting of pulmonary arterial hypertension. Further evaluation cardiac echo could performed as clinically indicated. 4. Atherosclerosis including coronary artery calcifications. Aortic Atherosclerosis (ICD10-170.0) Nonvascular Impression of the chest: 1. Mild apical and peripheral/subpleural predominant centrilobular emphysematous change. Emphysema. (ICD10-J43.9) 2. Suspected superimposed airways disease/bronchitis. No focal airspace opacities to suggest pneumonia. _________________________________________________ Vascular Impression of the abdomen and pelvis: 1. Presumably acute intraluminal thrombus within the aortic bifurcation extending to occlude the left common iliac artery and result in a focal severe (> 90%) luminal narrowing of the right common iliac artery. No evidence of abdominal aortic aneurysm or dissection. Given history of recent diagnosis of atrial fibrillation, this thrombus may be of cardiogenic origin. Further evaluation with cardiac echo is recommended. 2. The right common, superficial and deep femoral arteries are occluded throughout their course. Additionally, the right internal iliac artery is occluded. 3. 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 There  is reconstitution of the distal aspect of the left external iliac artery via supply from the ipsilateral left inferior epigastric and deep iliac circumflex arteries. The left common, superficial and deep femoral arteries appear patent. 4. Occlusion of a subsegmental branch of the cranial division of the right renal artery with associated evolving infarct involving the anterior/superior aspect the right kidney. 5. Age-indeterminate occlusion involving the origin and proximal  aspect of the IMA with early reconstitution via collateral supply from the SMA. Nonvascular Impression of the abdomen and pelvis: 1. No evidence of mesenteric ischemia. Specifically, no discrete areas of bowel wall thickening, pneumatosis or portal venous gas. Critical Value/emergent results were called by telephone at the time of interpretation on 06/23/2016 at 1:28 pm to Dr. Raeford RazorSTEPHEN KOHUT , who verbally acknowledged these results. Electronically Signed   By: Simonne ComeJohn  Watts M.D.   On: 21-Sep-2016 13:37   Ct Head Code Stroke W/o Cm  Result Date: 06/12/2016 CLINICAL DATA:  Code stroke. Sudden onset right lower extremity weakness and numbness below the knee beginning at 11:30 a.m. today. EXAM: CT HEAD WITHOUT CONTRAST TECHNIQUE: Contiguous axial images were obtained from the base of the skull through the vertex without intravenous contrast. COMPARISON:  None. FINDINGS: Moderate periventricular and subcortical white matter hypoattenuation is present bilaterally. This is symmetric. The basal ganglia are intact. Insular ribbon is normal bilaterally. No acute or focal cortical abnormality is present. ACA territories are intact. No acute hemorrhage or mass lesion is present. The ventricles are of normal size. A remote lacunar infarct is present in the right cerebellum. The brainstem and cerebellum are otherwise unremarkable. Mild mucosal thickening is present in the frontal sinuses and anterior ethmoid air cells. The remaining paranasal sinuses and the  mastoid air cells are clear. The calvarium is intact. No significant extracranial soft tissue lesion is present. ASPECTS [facility]([place] Stroke Program Early CT Score) - Ganglionic level infarction (caudate, lentiform nuclei, internal capsule, insula, M1-M3 cortex): 7/7 - Supraganglionic infarction (M4-M6 cortex): 3/3 Total score (0-10 with 10 being normal): 10/10 IMPRESSION: 1. No acute intracranial abnormality. 2. Moderate diffuse white matter disease likely reflects the sequela of chronic microvascular ischemia. 3. 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 4. ASPECTS is 10/10 These results were called by telephone at the time of interpretation on 06/18/2016 at 12:47 pm to Dr. Raeford RazorSTEPHEN KOHUT , who verbally acknowledged these results. Electronically Signed   By: Marin Robertshristopher  Mattern M.D.   On: 21-Sep-2016 12:51     STUDIES:    CULTURES:   ANTIBIOTICS: 9/9 vanco>>  SIGNIFICANT EVENTS: 9/9 to OR : #1 right iliac, femoral, superficial femoral and deep femoral embolectomy followed by intraoperative arteriogram and right popliteal artery exploration and tibial embolectomy #2 left iliac embolectomy #3 right arm subclavian exploration and thromboendarterectomy of chronic clot exploration of brachial artery  LINES/TUBES:   DISCUSSION: 75 yo male who was in his usual stat of health till 9/9 when his right leg stopped working and he fell. He was admitted as presumed stroke but ct's revealed multiple clots/occlusions and Afib. He was taken  to OR for embolectomies and will need to return to OR 9/10. Cardiology has seen him for new onset of A fib(possible cause of emboli). PCCM asked to assist due to K+ >7.0. He appears to be intravascular dry.   ASSESSMENT / PLAN:  PULMONARY A: Acute Hypoxic Respiratory Failure - Multifactorial. P:   Weaning FiO2 for Sat >92% Continuous Pulse Oximetry  CARDIOVASCULAR A:  Atrial Fibrillation Elevated Troponin I Cardiomegaly Enlarged Main Pulmonary  Artery Multiple Intra-Arterial Clots - S/P Thrombectomy 9/9 by Dr. Arbie CookeyEarly. H/O HTN P:  Cardiology Consult Pending Continuous Telemetry Monitoring Vitals per Unit Protocol Further surgery per Dr. Arbie CookeyEarly in AM on 9/10 Holding oral anti-hypertensives Hydralazine prn IV TTE pending  RENAL Lab Results  Component Value Date   CREATININE 1.52 (H) 06/17/2016   CREATININE 1.10 21-Sep-2016   CREATININE 1.11 21-Sep-2016  Recent Labs Lab 06/26/2016 1230 07-02-2016 0415 2016-07-02 0645  K 3.2* 7.3* 7.0*    A:   Renal insuff Hyperkalemia(suspect secondary to hypovolemia/hypoperfusion P:   IVF Kayexalate enema Follow creatine and K+ Hopefully will not need HD  GASTROINTESTINAL A:   GI protection P:   PPI  HEMATOLOGIC A:   Global emboli from presumed a fib Thrombocytopenia P:  Anticoagulation as needed ASA per Cards for + trop hematology workup in future  INFECTIOUS A:   No overt infection P:   Abx per surgery for prophylaxis   ENDOCRINE CBG (last 3)   Recent Labs  Jul 02, 2016 0759  GLUCAP 122*     A:   Hyperglycemia   P:   Follow glucose  NEUROLOGIC A:   Follows commands. 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 No family in room.  - Inter-disciplinary family meet or Palliative Care meeting due by:  9/17     Brett Canales Minor ACNP [facility] PCCM Pager [phone] till 3 pm If no answer page [phone] 07-02-2016, 7:36 AM   ATTENDING NOTE / ATTESTATION NOTE :   I have discussed the case with the resident/APP  Brett Canales Minor.   I agree with the resident/APP's  history, physical examination, assessment, and plans.    I have edited the above note and modified it according to our agreed history, physical examination, assessment and plan.   Patient admitted after sustaining a fall and eventually was found to have  clots/thrombus in his right subclavian artery, right iliac artery, right femoral artery, left iliac artery. Vascular surgery has been managing him. He has had several embolectomies in the vessels mentioned. Patient also recently went into atrial fibrillation and cardiology has been consulted. Clots could've been related to untreated atrial fibrillation and peripheral vascular disease. This morning, his potassium was elevated at 7.2 and his creatinine was 1.5. PCCM. His potassium has been treated with insulin, D50, Kayexalate. He is currently getting hydration. He has since gone to surgery for repeat embolectomy. Repeat labs show potassium better at 6.4. Creatinine elevated. CK > 50 K.  He is currently in sinus tachycardia/atrial fibrillation. Not in distress. Rest of exam as above.   Assessment and Plan : 1. AKI/Hyperkalemia/Rhabdomyolysis continue hydration. Will bolus with 1L over next 1 hr. He just received 1 L.  Inc IVF to 125 mls/hr. He is comfortable laying flat so I think we can bolus him and inc IVF to 125 mls/hr Repeat BMP at 7 pm. May need HD if with decrease in u.o or if hyperkalemia persists.  Will give another kayexalate. Cont D5 containing IVF, insulin drip.   2. Thrombus/Clot in R [place] artery, R iliac and femoral artery/ L iliac artery Per vascular surgery  3. Atrial Fibrillation Cont heparin drip   I have spent 30  minutes of critical care time with this patient today.  Family :Family updated at length today.      Pollie Meyer, MD 07-02-16, 5:28 PM [facility] Pager [phone] After 3 pm or if no answer, call [phone]

## 2016-06-21 ENCOUNTER — Encounter (HOSPITAL_COMMUNITY): Payer: Self-pay | Admitting: Vascular Surgery

## 2016-06-21 ENCOUNTER — Inpatient Hospital Stay (HOSPITAL_COMMUNITY): Payer: Medicare Other

## 2016-06-21 DIAGNOSIS — I998 Other disorder of circulatory system: Secondary | ICD-10-CM

## 2016-06-21 DIAGNOSIS — I10 Essential (primary) hypertension: Secondary | ICD-10-CM

## 2016-06-21 DIAGNOSIS — I639 Cerebral infarction, unspecified: Secondary | ICD-10-CM

## 2016-06-21 DIAGNOSIS — I8290 Acute embolism and thrombosis of unspecified vein: Secondary | ICD-10-CM

## 2016-06-21 DIAGNOSIS — I4891 Unspecified atrial fibrillation: Secondary | ICD-10-CM

## 2016-06-21 DIAGNOSIS — J9600 Acute respiratory failure, unspecified whether with hypoxia or hypercapnia: Secondary | ICD-10-CM

## 2016-06-21 LAB — CBC
HCT: 37.7 % — ABNORMAL LOW (ref 39.0–52.0)
Hemoglobin: 11.8 g/dL — ABNORMAL LOW (ref 13.0–17.0)
MCH: 28.9 pg (ref 26.0–34.0)
MCHC: 31.3 g/dL (ref 30.0–36.0)
MCV: 92.4 fL (ref 78.0–100.0)
PLATELETS: 105 10*3/uL — AB (ref 150–400)
RBC: 4.08 MIL/uL — AB (ref 4.22–5.81)
RDW: 14.6 % (ref 11.5–15.5)
WBC: 19.5 10*3/uL — AB (ref 4.0–10.5)

## 2016-06-21 LAB — ECHOCARDIOGRAM COMPLETE
HEIGHTINCHES: 68.898 in
WEIGHTICAEL: 4400 [oz_av]

## 2016-06-21 LAB — BASIC METABOLIC PANEL
ANION GAP: 16 — AB (ref 5–15)
Anion gap: 16 — ABNORMAL HIGH (ref 5–15)
Anion gap: 17 — ABNORMAL HIGH (ref 5–15)
BUN: 36 mg/dL — AB (ref 6–20)
BUN: 37 mg/dL — AB (ref 6–20)
BUN: 40 mg/dL — AB (ref 6–20)
CALCIUM: 6.9 mg/dL — AB (ref 8.9–10.3)
CALCIUM: 7.1 mg/dL — AB (ref 8.9–10.3)
CALCIUM: 7.2 mg/dL — AB (ref 8.9–10.3)
CO2: 15 mmol/L — ABNORMAL LOW (ref 22–32)
CO2: 17 mmol/L — ABNORMAL LOW (ref 22–32)
CO2: 18 mmol/L — AB (ref 22–32)
CREATININE: 3.23 mg/dL — AB (ref 0.61–1.24)
CREATININE: 3.72 mg/dL — AB (ref 0.61–1.24)
CREATININE: 4.18 mg/dL — AB (ref 0.61–1.24)
Chloride: 107 mmol/L (ref 101–111)
Chloride: 107 mmol/L (ref 101–111)
Chloride: 110 mmol/L (ref 101–111)
GFR calc Af Amer: 15 mL/min — ABNORMAL LOW (ref >60)
GFR calc Af Amer: 17 mL/min — ABNORMAL LOW (ref >60)
GFR calc Af Amer: 20 mL/min — ABNORMAL LOW (ref >60)
GFR calc non Af Amer: 13 mL/min — ABNORMAL LOW (ref >60)
GFR, EST NON AFRICAN AMERICAN: 14 mL/min — AB (ref >60)
GFR, EST NON AFRICAN AMERICAN: 17 mL/min — AB (ref >60)
GLUCOSE: 102 mg/dL — AB (ref 65–99)
GLUCOSE: 122 mg/dL — AB (ref 65–99)
Glucose, Bld: 99 mg/dL (ref 65–99)
Potassium: 6 mmol/L — ABNORMAL HIGH (ref 3.5–5.1)
Potassium: 6.2 mmol/L — ABNORMAL HIGH (ref 3.5–5.1)
Potassium: 6.8 mmol/L (ref 3.5–5.1)
SODIUM: 141 mmol/L (ref 135–145)
SODIUM: 141 mmol/L (ref 135–145)
Sodium: 141 mmol/L (ref 135–145)

## 2016-06-21 LAB — VAS US CAROTID
LCCADDIAS: -15 cm/s
LCCADSYS: -44 cm/s
LEFT ECA DIAS: -4 cm/s
LICADDIAS: -31 cm/s
LICADSYS: -67 cm/s
LICAPDIAS: -8 cm/s
LICAPSYS: -21 cm/s
Left CCA prox dias: 13 cm/s
Left CCA prox sys: 40 cm/s
RCCAPDIAS: 8 cm/s
RIGHT ECA DIAS: -7 cm/s
RIGHT VERTEBRAL DIAS: 24 cm/s
Right CCA prox sys: 34 cm/s
Right cca dist sys: -100 cm/s

## 2016-06-21 LAB — BLOOD GAS, ARTERIAL
ACID-BASE DEFICIT: 10.6 mmol/L — AB (ref 0.0–2.0)
Bicarbonate: 13.9 mmol/L — ABNORMAL LOW (ref 20.0–28.0)
DRAWN BY: 129711
FIO2: 0.21
O2 SAT: 93.9 %
PATIENT TEMPERATURE: 98.6
PCO2 ART: 25.8 mmHg — AB (ref 32.0–48.0)
pH, Arterial: 7.352 (ref 7.350–7.450)
pO2, Arterial: 81.1 mmHg — ABNORMAL LOW (ref 83.0–108.0)

## 2016-06-21 LAB — HEPATITIS B SURFACE ANTIBODY,QUALITATIVE: Hep B S Ab: NONREACTIVE

## 2016-06-21 LAB — LACTIC ACID, PLASMA
LACTIC ACID, VENOUS: 9.2 mmol/L — AB (ref 0.5–1.9)
Lactic Acid, Venous: 7.2 mmol/L (ref 0.5–1.9)

## 2016-06-21 LAB — HOMOCYSTEINE: HOMOCYSTEINE-NORM: 8.4 umol/L (ref 0.0–15.0)

## 2016-06-21 LAB — GLUCOSE, CAPILLARY
GLUCOSE-CAPILLARY: 108 mg/dL — AB (ref 65–99)
GLUCOSE-CAPILLARY: 108 mg/dL — AB (ref 65–99)

## 2016-06-21 LAB — MAGNESIUM: MAGNESIUM: 2.5 mg/dL — AB (ref 1.7–2.4)

## 2016-06-21 LAB — HEPATITIS B CORE ANTIBODY, TOTAL: HEP B C TOTAL AB: NEGATIVE

## 2016-06-21 LAB — HEMOGLOBIN A1C
HEMOGLOBIN A1C: 5.9 % — AB (ref 4.8–5.6)
HEMOGLOBIN A1C: 5.9 % — AB (ref 4.8–5.6)
MEAN PLASMA GLUCOSE: 123 mg/dL
Mean Plasma Glucose: 123 mg/dL

## 2016-06-21 LAB — APTT: APTT: 78 s — AB (ref 24–36)

## 2016-06-21 LAB — HEPARIN LEVEL (UNFRACTIONATED): Heparin Unfractionated: 0.4 IU/mL (ref 0.30–0.70)

## 2016-06-21 LAB — CK: Total CK: >50000 U/L — ABNORMAL HIGH (ref 49–397)

## 2016-06-21 LAB — HIV ANTIBODY (ROUTINE TESTING W REFLEX): HIV SCREEN 4TH GENERATION: NONREACTIVE

## 2016-06-21 LAB — PHOSPHORUS: Phosphorus: 7.3 mg/dL — ABNORMAL HIGH (ref 2.5–4.6)

## 2016-06-21 LAB — HEPATITIS C ANTIBODY: HCV Ab: <0.1 s/co ratio (ref 0.0–0.9)

## 2016-06-21 LAB — HEPATITIS A ANTIBODY, TOTAL: HEP A TOTAL AB: POSITIVE — AB

## 2016-06-21 MED ORDER — SODIUM POLYSTYRENE SULFONATE 15 GM/60ML PO SUSP
45.0000 g | Freq: Four times a day (QID) | ORAL | Status: DC
  Administered 2016-06-21 (×2): 45 g via RECTAL
  Filled 2016-06-21 (×2): qty 180

## 2016-06-21 MED ORDER — STERILE WATER FOR INJECTION IV SOLN
INTRAVENOUS | Status: DC
  Administered 2016-06-21: 01:00:00 via INTRAVENOUS
  Filled 2016-06-21 (×4): qty 850

## 2016-06-21 MED ORDER — SODIUM BICARBONATE 8.4 % IV SOLN
INTRAVENOUS | Status: AC
  Filled 2016-06-21: qty 100

## 2016-06-21 MED ORDER — PERFLUTREN LIPID MICROSPHERE
INTRAVENOUS | Status: AC
  Filled 2016-06-21: qty 10

## 2016-06-21 MED ORDER — PERFLUTREN LIPID MICROSPHERE
1.0000 mL | INTRAVENOUS | Status: AC | PRN
  Administered 2016-06-21: 2 mL via INTRAVENOUS
  Filled 2016-06-21: qty 10

## 2016-06-21 MED ORDER — SODIUM POLYSTYRENE SULFONATE 15 GM/60ML PO SUSP
45.0000 g | Freq: Once | ORAL | Status: DC
  Filled 2016-06-21: qty 180

## 2016-06-22 LAB — ANTIPHOSPHOLIPID SYNDROME EVAL, BLD
Anticardiolipin IgM: <9 MPL U/mL (ref 0–12)
DRVVT: 56.5 s — ABNORMAL HIGH (ref 0.0–47.0)
PTT Lupus Anticoagulant: 44.2 s (ref 0.0–51.9)
Phosphatydalserine, IgG: <1 GPS IgG (ref 0–11)
Phosphatydalserine, IgM: 1 MPS IgM (ref 0–25)

## 2016-06-22 LAB — DRVVT MIX: dRVVT Mix: 40.1 s (ref 0.0–47.0)

## 2016-06-24 MED FILL — Medication: Qty: 1 | Status: AC

## 2016-07-11 NOTE — Progress Notes (Signed)
RT responded to code blue in 2S03. ACLS started. RT manually ventilated pt until pt could be intubated. Pt with copious amounts of yellow thin vomit in airway and post extubation. CCM MD verified ett placement 3x but never got color change on etco2 dectector. ETCO2 hooked up to [place] with reading of 9 during ACLS protocol. Pt manually ventilated throughout duration of code. Pt expired, ett remains in place. RT will continue to monitor.

## 2016-07-11 NOTE — Progress Notes (Signed)
Subjective:  No Cp. Getting 2 D echo, S/P surgical embolectomy RSCA and RLE.   Objective:  Temp:  [96.8 F (36 C)-98.2 F (36.8 C)] 97.6 F (36.4 C) (09/11 0800) Pulse Rate:  [32-116] 93 (09/11 0900) Resp:  [10-23] 21 (09/11 0900) BP: (113-182)/(67-128) 143/85 (09/11 0900) SpO2:  [90 %-99 %] 96 % (09/11 0900) Weight change:   Intake/Output from previous day: 09/10 0701 - 09/11 0700 In: 4888.1 [P.O.:480; I.V.:3688.1; IV Piggyback:720] Out: 130 [Urine:30; Blood:100]  Intake/Output from this shift: Total I/O In: 137.5 [I.V.:137.5] Out: 10 [Urine:10]  Physical Exam: General appearance: alert and no distress Neck: no adenopathy, no carotid bruit, no JVD, supple, symmetrical, trachea midline and thyroid not enlarged, symmetric, no tenderness/mass/nodules Lungs: clear to auscultation bilaterally Heart: irregularly irregular rhythm Extremities: extremities normal, atraumatic, no cyanosis or edema  Lab Results: Results for orders placed or performed during the hospital encounter of 06/30/2016 (from the past 48 hour(s))  Protime-INR     Status: None   Collection Time: 07/08/2016 12:22 PM  Result Value Ref Range   Prothrombin Time 14.8 11.4 - 15.2 seconds   INR 1.15   APTT     Status: None   Collection Time: 07/10/2016 12:22 PM  Result Value Ref Range   aPTT 27 24 - 36 seconds  CBC     Status: None   Collection Time: 06/29/2016 12:22 PM  Result Value Ref Range   WBC 10.1 4.0 - 10.5 K/uL   RBC 4.99 4.22 - 5.81 MIL/uL   Hemoglobin 14.4 13.0 - 17.0 g/dL   HCT 45.2 39.0 - 52.0 %   MCV 90.6 78.0 - 100.0 fL   MCH 28.9 26.0 - 34.0 pg   MCHC 31.9 30.0 - 36.0 g/dL   RDW 14.1 11.5 - 15.5 %   Platelets 173 150 - 400 K/uL  Differential     Status: None   Collection Time: 06/30/2016 12:22 PM  Result Value Ref Range   Neutrophils Relative % 68 %   Neutro Abs 6.8 1.7 - 7.7 K/uL   Lymphocytes Relative 24 %   Lymphs Abs 2.4 0.7 - 4.0 K/uL   Monocytes Relative 7 %   Monocytes Absolute  0.7 0.1 - 1.0 K/uL   Eosinophils Relative 1 %   Eosinophils Absolute 0.1 0.0 - 0.7 K/uL   Basophils Relative 0 %   Basophils Absolute 0.0 0.0 - 0.1 K/uL  Comprehensive metabolic panel     Status: Abnormal   Collection Time: 06/28/2016 12:22 PM  Result Value Ref Range   Sodium 139 135 - 145 mmol/L   Potassium 3.2 (L) 3.5 - 5.1 mmol/L   Chloride 110 101 - 111 mmol/L   CO2 19 (L) 22 - 32 mmol/L   Glucose, Bld 195 (H) 65 - 99 mg/dL   BUN 19 6 - 20 mg/dL   Creatinine, Ser 1.11 0.61 - 1.24 mg/dL   Calcium 8.9 8.9 - 10.3 mg/dL   Total Protein 6.6 6.5 - 8.1 g/dL   Albumin 3.5 3.5 - 5.0 g/dL   AST 25 15 - 41 U/L   ALT 14 (L) 17 - 63 U/L   Alkaline Phosphatase 86 38 - 126 U/L   Total Bilirubin 0.8 0.3 - 1.2 mg/dL   GFR calc non Af Amer >60 >60 mL/min   GFR calc Af Amer >60 >60 mL/min    Comment: (NOTE) The eGFR has been calculated using the CKD EPI equation. This calculation has not been validated in all  clinical situations. eGFR's persistently <60 mL/min signify possible Chronic Kidney Disease.    Anion gap 10 5 - 15  I-stat troponin, ED     Status: Abnormal   Collection Time: 06/11/2016 12:29 PM  Result Value Ref Range   Troponin i, poc 0.15 (HH) 0.00 - 0.08 ng/mL   Comment NOTIFIED PHYSICIAN    Comment 3            Comment: Due to the release kinetics of cTnI, a negative result within the first hours of the onset of symptoms does not rule out myocardial infarction with certainty. If myocardial infarction is still suspected, repeat the test at appropriate intervals.   I-Stat Chem 8, ED     Status: Abnormal   Collection Time: 06/27/2016 12:30 PM  Result Value Ref Range   Sodium 143 135 - 145 mmol/L   Potassium 3.2 (L) 3.5 - 5.1 mmol/L   Chloride 106 101 - 111 mmol/L   BUN 21 (H) 6 - 20 mg/dL   Creatinine, Ser 1.10 0.61 - 1.24 mg/dL   Glucose, Bld 194 (H) 65 - 99 mg/dL   Calcium, Ion 1.08 (L) 1.15 - 1.40 mmol/L   TCO2 21 0 - 100 mmol/L   Hemoglobin 15.6 13.0 - 17.0 g/dL   HCT  46.0 39.0 - 52.0 %  Troponin I (q 6hr x 3)     Status: Abnormal   Collection Time: 06/28/2016  3:06 PM  Result Value Ref Range   Troponin I 5.32 (HH) <0.03 ng/mL    Comment: CRITICAL RESULT CALLED TO, READ BACK BY AND VERIFIED WITH: BASS,B RN 06/25/2016 0101 JORDANS   TSH     Status: None   Collection Time: 07/09/2016  3:08 PM  Result Value Ref Range   TSH 3.388 0.350 - 4.500 uIU/mL  MRSA PCR Screening     Status: None   Collection Time: 06/22/2016  1:15 AM  Result Value Ref Range   MRSA by PCR NEGATIVE NEGATIVE    Comment:        The GeneXpert MRSA Assay (FDA approved for NASAL specimens only), is one component of a comprehensive MRSA colonization surveillance program. It is not intended to diagnose MRSA infection nor to guide or monitor treatment for MRSA infections.   Troponin I (q 6hr x 3)     Status: Abnormal   Collection Time: 07/01/2016  4:15 AM  Result Value Ref Range   Troponin I 4.64 (HH) <0.03 ng/mL    Comment: CRITICAL VALUE NOTED.  VALUE IS CONSISTENT WITH PREVIOUSLY REPORTED AND CALLED VALUE.  CBC     Status: Abnormal   Collection Time: 06/26/2016  4:15 AM  Result Value Ref Range   WBC 17.1 (H) 4.0 - 10.5 K/uL   RBC 4.90 4.22 - 5.81 MIL/uL   Hemoglobin 14.5 13.0 - 17.0 g/dL   HCT 44.3 39.0 - 52.0 %   MCV 90.4 78.0 - 100.0 fL   MCH 29.6 26.0 - 34.0 pg   MCHC 32.7 30.0 - 36.0 g/dL   RDW 14.6 11.5 - 15.5 %   Platelets 110 (L) 150 - 400 K/uL    Comment: PLATELET COUNT CONFIRMED BY SMEAR REPEATED TO VERIFY SPECIMEN CHECKED FOR CLOTS   Lipid panel     Status: Abnormal   Collection Time: 06/15/2016  4:15 AM  Result Value Ref Range   Cholesterol 163 0 - 200 mg/dL   Triglycerides 43 <150 mg/dL   HDL 44 >40 mg/dL   Total CHOL/HDL Ratio 3.7 RATIO  VLDL 9 0 - 40 mg/dL   LDL Cholesterol 110 (H) 0 - 99 mg/dL    Comment:        Total Cholesterol/HDL:CHD Risk Coronary Heart Disease Risk Table                     Men   Women  1/2 Average Risk   3.4   3.3  Average Risk        5.0   4.4  2 X Average Risk   9.6   7.1  3 X Average Risk  23.4   11.0        Use the calculated Patient Ratio above and the CHD Risk Table to determine the patient's CHD Risk.        ATP III CLASSIFICATION (LDL):  <100     mg/dL   Optimal  100-129  mg/dL   Near or Above                    Optimal  130-159  mg/dL   Borderline  160-189  mg/dL   High  >190     mg/dL   Very High   Basic metabolic panel     Status: Abnormal   Collection Time: 07/02/2016  4:15 AM  Result Value Ref Range   Sodium 138 135 - 145 mmol/L   Potassium 7.3 (HH) 3.5 - 5.1 mmol/L    Comment: CRITICAL RESULT CALLED TO, READ BACK BY AND VERIFIED WITH: BASS,B RN 06/27/2016 0507 JORDANS    Chloride 105 101 - 111 mmol/L   CO2 18 (L) 22 - 32 mmol/L   Glucose, Bld 147 (H) 65 - 99 mg/dL   BUN 21 (H) 6 - 20 mg/dL   Creatinine, Ser 1.52 (H) 0.61 - 1.24 mg/dL   Calcium 7.9 (L) 8.9 - 10.3 mg/dL   GFR calc non Af Amer 43 (L) >60 mL/min   GFR calc Af Amer 50 (L) >60 mL/min    Comment: (NOTE) The eGFR has been calculated using the CKD EPI equation. This calculation has not been validated in all clinical situations. eGFR's persistently <60 mL/min signify possible Chronic Kidney Disease.    Anion gap 15 5 - 15  Potassium     Status: Abnormal   Collection Time: 06/23/2016  6:45 AM  Result Value Ref Range   Potassium 7.0 (HH) 3.5 - 5.1 mmol/L    Comment: CRITICAL RESULT CALLED TO, READ BACK BY AND VERIFIED WITH: VISIRONRN [id number] MCCAULEG NO VISIBLE HEMOLYSIS   Protime-INR     Status: Abnormal   Collection Time: 06/26/2016  6:48 AM  Result Value Ref Range   Prothrombin Time 15.7 (H) 11.4 - 15.2 seconds   INR 1.24   APTT     Status: None   Collection Time: 06/29/2016  6:48 AM  Result Value Ref Range   aPTT 30 24 - 36 seconds  Lactic acid, plasma     Status: Abnormal   Collection Time: 07/02/2016  6:48 AM  Result Value Ref Range   Lactic Acid, Venous 5.6 (HH) 0.5 - 1.9 mmol/L    Comment: CRITICAL RESULT CALLED  TO, READ BACK BY AND VERIFIED WITH: VISIRISNRN [id number] M[place]AU[place]G   Hepatic function panel     Status: Abnormal   Collection Time: 06/17/2016  6:48 AM  Result Value Ref Range   Total Protein 5.6 (L) 6.5 - 8.1 g/dL   Albumin 3.0 (L) 3.5 - 5.0 g/dL   AST 486 (  H) 15 - 41 U/L   ALT 145 (H) 17 - 63 U/L   Alkaline Phosphatase 74 38 - 126 U/L   Total Bilirubin 0.7 0.3 - 1.2 mg/dL   Bilirubin, Direct 0.1 0.1 - 0.5 mg/dL   Indirect Bilirubin 0.6 0.3 - 0.9 mg/dL  Glucose, capillary     Status: Abnormal   Collection Time: 06/29/2016  7:59 AM  Result Value Ref Range   Glucose-Capillary 122 (H) 65 - 99 mg/dL   Comment 1 Capillary Specimen    Comment 2 Notify RN   I-STAT 4, (NA,K, GLUC, HGB,HCT)     Status: Abnormal   Collection Time: 06/25/2016 11:39 AM  Result Value Ref Range   Sodium 138 135 - 145 mmol/L   Potassium 7.2 (HH) 3.5 - 5.1 mmol/L   Glucose, Bld 131 (H) 65 - 99 mg/dL   HCT 38.0 (L) 39.0 - 52.0 %   Hemoglobin 12.9 (L) 13.0 - 17.0 g/dL   Comment NOTIFIED PHYSICIAN   I-STAT 4, (NA,K, GLUC, HGB,HCT)     Status: Abnormal   Collection Time: 07/01/2016 12:39 PM  Result Value Ref Range   Sodium 138 135 - 145 mmol/L   Potassium 7.1 (HH) 3.5 - 5.1 mmol/L   Glucose, Bld 104 (H) 65 - 99 mg/dL   HCT 40.0 39.0 - 52.0 %   Hemoglobin 13.6 13.0 - 17.0 g/dL   Comment NOTIFIED PHYSICIAN   Lactic acid, plasma     Status: Abnormal   Collection Time: 07/09/2016 12:44 PM  Result Value Ref Range   Lactic Acid, Venous 4.7 (HH) 0.5 - 1.9 mmol/L    Comment: CRITICAL RESULT CALLED TO, READ BACK BY AND VERIFIED WITH: T.CITTY,RN 07/03/2016 '@1505'  BY V.WILKINS   Glucose, capillary     Status: Abnormal   Collection Time: 06/30/2016  1:35 PM  Result Value Ref Range   Glucose-Capillary 119 (H) 65 - 99 mg/dL   Comment 1 Document in Chart   Glucose, capillary     Status: None   Collection Time: 06/18/2016  3:47 PM  Result Value Ref Range   Glucose-Capillary 75 65 - 99 mg/dL   Comment 1 Capillary Specimen     Comment 2 Notify RN   Basic metabolic panel     Status: Abnormal   Collection Time: 07/03/2016  3:56 PM  Result Value Ref Range   Sodium 139 135 - 145 mmol/L   Potassium 6.4 (HH) 3.5 - 5.1 mmol/L    Comment: CRITICAL RESULT CALLED TO, READ BACK BY AND VERIFIED WITH: RN S MARSH AT [id number] MARTINB    Chloride 108 101 - 111 mmol/L   CO2 20 (L) 22 - 32 mmol/L   Glucose, Bld 95 65 - 99 mg/dL   BUN 31 (H) 6 - 20 mg/dL   Creatinine, Ser 2.96 (H) 0.61 - 1.24 mg/dL    Comment: DELTA CHECK NOTED   Calcium 7.1 (L) 8.9 - 10.3 mg/dL   GFR calc non Af Amer 19 (L) >60 mL/min   GFR calc Af Amer 22 (L) >60 mL/min    Comment: (NOTE) The eGFR has been calculated using the CKD EPI equation. This calculation has not been validated in all clinical situations. eGFR's persistently <60 mL/min signify possible Chronic Kidney Disease.    Anion gap 11 5 - 15  CK     Status: Abnormal   Collection Time: 07/08/2016  3:56 PM  Result Value Ref Range   Total CK >50,000 (H) 49 - 397 U/L  Comment: RESULTS CONFIRMED BY MANUAL DILUTION  Hemoglobin A1c     Status: Abnormal   Collection Time: 07/10/2016  4:14 PM  Result Value Ref Range   Hgb A1c MFr Bld 5.9 (H) 4.8 - 5.6 %    Comment: (NOTE)         Pre-diabetes: 5.7 - 6.4         Diabetes: >6.4         Glycemic control for adults with diabetes: <7.0    Mean Plasma Glucose 123 mg/dL    Comment: (NOTE) Performed At: [facility] [address] [place], [place] [id number] Lindon Romp MD SV:[id number]   Glucose, capillary     Status: None   Collection Time: 06/23/2016  5:06 PM  Result Value Ref Range   Glucose-Capillary 78 65 - 99 mg/dL  Lactic acid, plasma     Status: Abnormal   Collection Time: 06/23/2016  6:35 PM  Result Value Ref Range   Lactic Acid, Venous 8.7 (HH) 0.5 - 1.9 mmol/L    Comment: CRITICAL RESULT CALLED TO, READ BACK BY AND VERIFIED WITH: RN BASS,B AT [id number] [phone] MARTINB   Basic metabolic panel     Status: Abnormal    Collection Time: 06/28/2016  6:35 PM  Result Value Ref Range   Sodium 141 135 - 145 mmol/L   Potassium 6.8 (HH) 3.5 - 5.1 mmol/L    Comment: CRITICAL RESULT CALLED TO, READ BACK BY AND VERIFIED WITH: RN B BASS AT [id number] MARTINB    Chloride 109 101 - 111 mmol/L   CO2 17 (L) 22 - 32 mmol/L   Glucose, Bld 59 (L) 65 - 99 mg/dL   BUN 31 (H) 6 - 20 mg/dL   Creatinine, Ser 2.92 (H) 0.61 - 1.24 mg/dL   Calcium 7.4 (L) 8.9 - 10.3 mg/dL   GFR calc non Af Amer 19 (L) >60 mL/min   GFR calc Af Amer 23 (L) >60 mL/min    Comment: (NOTE) The eGFR has been calculated using the CKD EPI equation. This calculation has not been validated in all clinical situations. eGFR's persistently <60 mL/min signify possible Chronic Kidney Disease.    Anion gap 15 5 - 15  Glucose, capillary     Status: Abnormal   Collection Time: 06/26/2016  6:53 PM  Result Value Ref Range   Glucose-Capillary 44 (LL) 65 - 99 mg/dL  Glucose, capillary     Status: Abnormal   Collection Time: 06/17/2016  7:31 PM  Result Value Ref Range   Glucose-Capillary 23 (LL) 65 - 99 mg/dL   Comment 1 Capillary Specimen    Comment 2 Notify RN    Comment 3 Document in Chart   Glucose, capillary     Status: Abnormal   Collection Time: 06/11/2016  7:57 PM  Result Value Ref Range   Glucose-Capillary 21 (LL) 65 - 99 mg/dL   Comment 1 Capillary Specimen   Basic metabolic panel     Status: Abnormal   Collection Time: 06/15/2016  8:10 PM  Result Value Ref Range   Sodium 133 (L) 135 - 145 mmol/L    Comment: DELTA CHECK NOTED   Potassium 6.4 (HH) 3.5 - 5.1 mmol/L    Comment: CRITICAL RESULT CALLED TO, READ BACK BY AND VERIFIED WITH: RN R SARINE AT [id number] MARTINB    Chloride 106 101 - 111 mmol/L   CO2 13 (L) 22 - 32 mmol/L   Glucose, Bld 131 (H) 65 - 99 mg/dL  BUN 33 (H) 6 - 20 mg/dL   Creatinine, Ser 3.00 (H) 0.61 - 1.24 mg/dL   Calcium 6.7 (L) 8.9 - 10.3 mg/dL   GFR calc non Af Amer 19 (L) >60 mL/min   GFR calc Af Amer 22 (L) >60  mL/min    Comment: (NOTE) The eGFR has been calculated using the CKD EPI equation. This calculation has not been validated in all clinical situations. eGFR's persistently <60 mL/min signify possible Chronic Kidney Disease.    Anion gap 14 5 - 15  Glucose, capillary     Status: Abnormal   Collection Time: 06/13/2016 11:33 PM  Result Value Ref Range   Glucose-Capillary 20 (LL) 65 - 99 mg/dL   Comment 1 Capillary Specimen    Comment 2 Notify RN    Comment 3 Document in Chart   Basic metabolic panel     Status: Abnormal   Collection Time: 07/04/2016 11:57 PM  Result Value Ref Range   Sodium 141 135 - 145 mmol/L    Comment: DELTA CHECK NOTED   Potassium 6.8 (HH) 3.5 - 5.1 mmol/L    Comment: CRITICAL RESULT CALLED TO, READ BACK BY AND VERIFIED WITH: BASS B,RN 07-14-2016 0047 WAYK    Chloride 110 101 - 111 mmol/L   CO2 15 (L) 22 - 32 mmol/L   Glucose, Bld 102 (H) 65 - 99 mg/dL   BUN 36 (H) 6 - 20 mg/dL   Creatinine, Ser 3.23 (H) 0.61 - 1.24 mg/dL   Calcium 6.9 (L) 8.9 - 10.3 mg/dL   GFR calc non Af Amer 17 (L) >60 mL/min   GFR calc Af Amer 20 (L) >60 mL/min    Comment: (NOTE) The eGFR has been calculated using the CKD EPI equation. This calculation has not been validated in all clinical situations. eGFR's persistently <60 mL/min signify possible Chronic Kidney Disease.    Anion gap 16 (H) 5 - 15  CK     Status: Abnormal   Collection Time: 07/10/2016 11:57 PM  Result Value Ref Range   Total CK >50,000 (H) 49 - 397 U/L    Comment: RESULTS CONFIRMED BY MANUAL DILUTION  Glucose, capillary     Status: Abnormal   Collection Time: 06/16/2016 11:59 PM  Result Value Ref Range   Glucose-Capillary 108 (H) 65 - 99 mg/dL   Comment 1 Venous Specimen   Lactic acid, plasma     Status: Abnormal   Collection Time: 07/14/16  4:09 AM  Result Value Ref Range   Lactic Acid, Venous 9.2 (HH) 0.5 - 1.9 mmol/L    Comment: CRITICAL RESULT CALLED TO, READ BACK BY AND VERIFIED WITH: BASS,B RN 07/14/16 0554  JORDANS   Basic metabolic panel     Status: Abnormal   Collection Time: 07-14-16  4:09 AM  Result Value Ref Range   Sodium 141 135 - 145 mmol/L   Potassium 6.2 (H) 3.5 - 5.1 mmol/L   Chloride 107 101 - 111 mmol/L   CO2 17 (L) 22 - 32 mmol/L   Glucose, Bld 99 65 - 99 mg/dL   BUN 37 (H) 6 - 20 mg/dL   Creatinine, Ser 3.72 (H) 0.61 - 1.24 mg/dL   Calcium 7.1 (L) 8.9 - 10.3 mg/dL   GFR calc non Af Amer 14 (L) >60 mL/min   GFR calc Af Amer 17 (L) >60 mL/min    Comment: (NOTE) The eGFR has been calculated using the CKD EPI equation. This calculation has not been validated in all clinical situations.  eGFR's persistently <60 mL/min signify possible Chronic Kidney Disease.    Anion gap 17 (H) 5 - 15  Glucose, capillary     Status: Abnormal   Collection Time: 2016-06-25  4:11 AM  Result Value Ref Range   Glucose-Capillary 108 (H) 65 - 99 mg/dL   Comment 1 Venous Specimen   CBC     Status: Abnormal   Collection Time: 06/25/2016  4:22 AM  Result Value Ref Range   WBC 19.5 (H) 4.0 - 10.5 K/uL   RBC 4.08 (L) 4.22 - 5.81 MIL/uL   Hemoglobin 11.8 (L) 13.0 - 17.0 g/dL   HCT 37.7 (L) 39.0 - 52.0 %   MCV 92.4 78.0 - 100.0 fL   MCH 28.9 26.0 - 34.0 pg   MCHC 31.3 30.0 - 36.0 g/dL   RDW 14.6 11.5 - 15.5 %   Platelets 105 (L) 150 - 400 K/uL    Comment: REPEATED TO VERIFY CONSISTENT WITH PREVIOUS RESULT   Phosphorus     Status: Abnormal   Collection Time: 06-25-16  4:22 AM  Result Value Ref Range   Phosphorus 7.3 (H) 2.5 - 4.6 mg/dL  Magnesium     Status: Abnormal   Collection Time: June 25, 2016  4:22 AM  Result Value Ref Range   Magnesium 2.5 (H) 1.7 - 2.4 mg/dL  APTT     Status: Abnormal   Collection Time: 2016/06/25  4:22 AM  Result Value Ref Range   aPTT 78 (H) 24 - 36 seconds    Comment:        IF BASELINE aPTT IS ELEVATED, SUGGEST PATIENT RISK ASSESSMENT BE USED TO DETERMINE APPROPRIATE ANTICOAGULANT THERAPY.   Heparin level (unfractionated)     Status: None   Collection Time:  06/25/2016  4:22 AM  Result Value Ref Range   Heparin Unfractionated 0.40 0.30 - 0.70 IU/mL    Comment:        IF HEPARIN RESULTS ARE BELOW EXPECTED VALUES, AND PATIENT DOSAGE HAS BEEN CONFIRMED, SUGGEST FOLLOW UP TESTING OF ANTITHROMBIN III LEVELS.   Basic metabolic panel     Status: Abnormal   Collection Time: 06/25/2016  8:28 AM  Result Value Ref Range   Sodium 141 135 - 145 mmol/L   Potassium 6.0 (H) 3.5 - 5.1 mmol/L   Chloride 107 101 - 111 mmol/L   CO2 18 (L) 22 - 32 mmol/L   Glucose, Bld 122 (H) 65 - 99 mg/dL   BUN 40 (H) 6 - 20 mg/dL   Creatinine, Ser 4.18 (H) 0.61 - 1.24 mg/dL   Calcium 7.2 (L) 8.9 - 10.3 mg/dL   GFR calc non Af Amer 13 (L) >60 mL/min   GFR calc Af Amer 15 (L) >60 mL/min    Comment: (NOTE) The eGFR has been calculated using the CKD EPI equation. This calculation has not been validated in all clinical situations. eGFR's persistently <60 mL/min signify possible Chronic Kidney Disease.    Anion gap 16 (H) 5 - 15  Glucose, capillary     Status: Abnormal   Collection Time: 06/25/16  8:42 AM  Result Value Ref Range   Glucose-Capillary 108 (H) 65 - 99 mg/dL    Imaging: Imaging results have been reviewed  Tele- Afib with RVR  Assessment/Plan:   1. Active Problems: 2.   Arterial thrombosis ([place]) 3.   Limb ischemia 4.   Atrial fibrillation ([place]) 5.   HTN (hypertension) 6.   Thrombus 7.   Cerebral embolism with cerebral infarction 8.  HLD (hyperlipidemia) 9.   AKI (acute kidney injury) ([place]) 10.   Hyperkalemia 11.   Time Spent Directly with Patient:  20 minutes  Length of Stay:  LOS: 2 days   Pt admitted 9/9 c/o RLE discomfort. He has ? New onset AFIB with RVR and mult embolic phenomenon to RUE and RLE s/p surgical embolectomy. He remains on IV hep and Dilt for rate control in ICU. SCR has progressively gotten worse (3.72), has been nl early in hosp. ? ATN from [facility]. CK tot >50K for CLI. 2D appears nl but await official interp. Will  ultimately require NOAC and prob DCCV once anticoagulated for 4-6 weeks (TEE). Doubt trop elev (5) due to ACS. EKG does show septal Q's but no acute changes. Will follow with you.  Quay Burow 07/18/16, 9:16 AM

## 2016-07-11 NOTE — Progress Notes (Signed)
Progress Note    08-Feb-2016 8:41 AM 1 Day Post-Op  Subjective:  C/o his stomach hurts  Afebrile HR  70's-110's  130's-170's systolic 97% RA  Gtts:    Heparin Cardizem IVF with sodium bicarb Insulin off at MN  Vitals:   02/28/16 0700 02/28/16 0800  BP: (!) 147/95 (!) 174/88  Pulse: (!) 55 (!) 116  Resp: 17 19  Temp:      Physical Exam: Cardiac:  irregular Lungs:  Non labored Incisions:  Right chest incision with some fullness; bilateral groin incisions are clean and dry; medial fasciotomy beefy red; lateral fasciotomy pale with venous ooze distally. Extremities:  Brisk right peroneal doppler signal; faint left peroneal; minimal sensation RLE Abdomen:  Soft, NT to palpation; +BM last evening; mild nausea-no vomiting  CBC    Component Value Date/Time   WBC 19.5 (H) 030-Apr-2017 0422   RBC 4.08 (L) 030-Apr-2017 0422   HGB 11.8 (L) 030-Apr-2017 0422   HCT 37.7 (L) 030-Apr-2017 0422   PLT 105 (L) 030-Apr-2017 0422   MCV 92.4 030-Apr-2017 0422   MCH 28.9 030-Apr-2017 0422   MCHC 31.3 030-Apr-2017 0422   RDW 14.6 030-Apr-2017 0422   LYMPHSABS 2.4 06/11/2016 1222   MONOABS 0.7 06/29/2016 1222   EOSABS 0.1 06/11/2016 1222   BASOSABS 0.0 06/13/2016 1222    BMET    Component Value Date/Time   NA 141 030-Apr-2017 0409   K 6.2 (H) 030-Apr-2017 0409   CL 107 030-Apr-2017 0409   CO2 17 (L) 030-Apr-2017 0409   GLUCOSE 99 030-Apr-2017 0409   BUN 37 (H) 030-Apr-2017 0409   CREATININE 3.72 (H) 030-Apr-2017 0409   CALCIUM 7.1 (L) 030-Apr-2017 0409   GFRNONAA 14 (L) 030-Apr-2017 0409   GFRAA 17 (L) 030-Apr-2017 0409    INR    Component Value Date/Time   INR 1.24 07/02/2016 0648     Intake/Output Summary (Last 24 hours) at 02/28/16 0841 Last data filed at 02/28/16 0800  Gross per 24 hour  Intake          4925.59 ml  Output              120 ml  Net          4805.59 ml     Assessment:  75 y.o. 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 Lateral to compartment fasciotomy and extension of medial fasciotomy   1 Day Post-Op   Plan: -pt with brisk right peroneal doppler signal; faint left peroneal  -fasciotomy dressings removed-medial is beefy red and lateral is pale with some venous ooze-manual pressure held for 20 minutes and dressing reapplied. -ARF-appreciate renal's assistance.  K+ overnight was 6.8 and now 6.2 this am.  Kayexalate enemas being given.  Creatinine continues to rise and is 3.7 this am.  Bicarb gtt.  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

## 2016-07-11 NOTE — Procedures (Signed)
ACLS  Pea, some VFIb Treated empiric hyperkalemia Shock, amio, acls followed Unable to resu promounced dead,  I called vasc and daughter  Luis RossettiDaniel J. Tyson [name], MD, FACP Pgr: [phone] [facility]

## 2016-07-11 NOTE — Progress Notes (Signed)
Responded to code blue to support patient. Patient died.No family present. Provided support to staff.    06/18/2016 1030  Clinical Encounter Type  Visited With Patient;Health care provider  Visit Type Code;Spiritual support;Initial;Death  Referral From Nurse  Ephraim Hamburgerynthia A Noella Kipnis, Chaplain

## 2016-07-11 NOTE — Progress Notes (Signed)
E-Link MD responded. Informed him of patient's Calcium of 7.1 No orders received at this time. Will continue to monitor the patient closely.  Marlou PorchBradley Ansley Mangiapane

## 2016-07-11 NOTE — Procedures (Signed)
Intubation Procedure Note Elijio MilesJohn Lumley [id number] [birth year]  Procedure: Intubation Indications: coding  Procedure Details Consent: Unable to obtain consent because of emergent medical necessity. Time Out: Verified patient identification, verified procedure, site/side was marked, verified correct patient position, special equipment/implants available, medications/allergies/relevent history reviewed, required imaging and test results available.  Performed  Maximum sterile technique was used including gloves, gown and hand hygiene.  MAC and 3    Evaluation Hemodynamic Status: dode; O2 sats: code  Patient's Current Condition: unstable Complications: No apparent complications Patient did tolerate procedure well. Chest X-ray ordered to verify placement.  CXR: pending.   Nelda BucksFEINSTEIN,Tinnie Kunin J. 07/08/2016   During code Placed ett in setting of massive aspiration of yellow stomach contents directly visualized easily, poor cap from massive asp, CO2 and cap eventually change but after bagging in code ETT was directly visualized x 3 to ensure placed correctly whch it was Placed NGt with stomach contents back ett bag easily  Mcarthur Rossettianiel J. Tyson [name], MD, FACP Pgr: [phone] [facility]

## 2016-07-11 NOTE — Anesthesia Postprocedure Evaluation (Signed)
Anesthesia Post Note  Patient: Luis MilesJohn Tiley  Procedure(s) Performed: Procedure(s) (LRB): THROMBECTOMY OF RIGHT ARM Brachial and subclavian thrombectomy.  Left  iliac  thrombectomy and  right iliac, Femoral and Popliteal  artery thrombectomy. (Bilateral) ANGIOGRAM lower EXTREMITY RIGHT X 2 (Right)  Patient location during evaluation: PACU Anesthesia Type: General Level of consciousness: awake and alert Pain management: pain level controlled Vital Signs Assessment: post-procedure vital signs reviewed and stable Respiratory status: spontaneous breathing, nonlabored ventilation, respiratory function stable and patient connected to nasal cannula oxygen Cardiovascular status: blood pressure returned to baseline and stable Postop Assessment: no signs of nausea or vomiting Anesthetic complications: no    Last Vitals:  Vitals:   06/15/2016 0900 06/27/2016 1000  BP: (!) 143/85 (!) 139/108  Pulse: 93 89  Resp: (!) 21 16  Temp:      Last Pain:  Vitals:   07/06/2016 1000  TempSrc:   PainSc: 3                  Adessa Primiano,W. EDMOND

## 2016-07-11 NOTE — Progress Notes (Signed)
Attempted to make contact with patients emergency contacts during [facility].   Barry DienesStephens,James Brother [phone]    Harden MoMcAdoo,Eddie Friend [phone]     Fayrene FearingJames returned my call and informed me he was at home recovering from a stroke and unable to come to the hospital and would call his sister that lived in [place].  He knew of his brothers daughter but did not know how to contact her.  Made him aware of the resuscitation in process.  Dr. Tyson [name] made aware of the social situation.   Jacqulyn Canehristopher Scott Evalina Tabak RN, BSN, CCRN

## 2016-07-11 NOTE — Progress Notes (Signed)
Entered the patient's room after Thayer Ohmhris, RN called into the room. Patient was vomiting and had a bowel movement. After suctioning the patient he started to have a fixed gaze and started to be unresponsive. Patient become bradycardiac and called code. See code sheet.

## 2016-07-11 NOTE — Progress Notes (Signed)
Internal Medicine Attending:   I saw and examined the patient. I reviewed the resident's note and I agree with the resident's findings and plan as documented in the resident's note. I saw [name] this AM on rounds with Dr Vincente LibertyMolt, he reported mild abdominal pain but was pleased that he was able to have some increased movement of his extremities otherwise he had no compilations.  On exam he had a mildly tachycardic irr irr rate, both feet were cool to touch, he had mild tenderness of his abdomen, and his lungs were CTA.  A little later in the morning he became unresponsive and a CODE Blue was called.  Critical care responded and found him to be in PEA, he was treated per ACLS protocol but was unable to be resuscitated and was pronounced dead.

## 2016-07-11 NOTE — Consult Note (Signed)
PULMONARY / CRITICAL CARE MEDICINE   Name: Luis Sellers MRN: [id number] DOB: 1940/10/13    ADMISSION DATE:  2016-07-05 CONSULTATION DATE: 9/10  REFERRING MD: Early  CHIEF COMPLAINT:  Multiple emboli  HISTORY OF PRESENT ILLNESS:   75 yo male who was in his usual stat of health till 9/9 when his right leg stopped working and he fell. He was admitted as presumed stroke but ct's revealed multiple clots/occlusions and Afib. He was taken  to OR for embolectomies and will need to return to OR 9/10. Cardiology has seen him for new onset of A fib(possible cause of emboli). PCCM asked to assist due to K+ >7.0. 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 9/10 to return to OR.   SUBJECTIVE:  No distress K down to 6.2  VITAL SIGNS: BP (!) 174/88 (BP Location: Right Arm)   Pulse (!) 116   Temp 98.2 F (36.8 C) (Oral)   Resp 19   Ht 5' 8.9" (1.75 m)   Wt 124.7 kg (275 lb)   SpO2 96%   BMI 40.73 kg/m   HEMODYNAMICS:    VENTILATOR SETTINGS:    INTAKE / OUTPUT: I/O last 3 completed shifts: In: 7638.1 [P.O.:480; I.V.:6238.1; IV Piggyback:920] Out: 925 [Urine:675; Blood:250]  PHYSICAL EXAMINATION: General:  Obese AAM NAD at rest Neuro: Follows command by [employer]. No sensation from from rt knee to foot HEENT: PERL, jvd low Cardiovascular: HSIR IR Lungs: anterior clear Abdomen: Obese +bs, no r/g Musculoskeletal:  intact Skin:  Rt [place] , rt arm, rt lower ext dressing intact  LABS:  BMET  Recent Labs Lab 06/12/2016 2010 07/09/2016 2357 06/27/2016 0409  NA 133* 141 141  K 6.4* 6.8* 6.2*  CL 106 110 107  CO2 13* 15* 17*  BUN 33* 36* 37*  CREATININE 3.00* 3.23* 3.72*  GLUCOSE 131* 102*  99    Electrolytes  Recent Labs Lab 06/29/2016 2010 06/24/2016 2357 06/17/2016 0409 06/13/2016 0422  CALCIUM 6.7* 6.9* 7.1*  --   MG  --   --   --  2.5*  PHOS  --   --   --  7.3*    CBC  Recent Labs Lab 07-05-2016 1222  07/02/2016 0415 06/24/2016 1139 06/25/2016 1239 06/14/2016 0422  WBC 10.1  --  17.1*  --   --  19.5*  HGB 14.4  < > 14.5 12.9* 13.6 11.8*  HCT 45.2  < > 44.3 38.0* 40.0 37.7*  PLT 173  --  110*  --   --  105*  < > = values in this interval not displayed.  Coag's  Recent Labs Lab 07/05/2016 1222 07/08/2016 0648 06/22/2016 0422  APTT 27 30 78*  INR 1.15 1.24  --     Sepsis Markers  Recent Labs Lab 06/28/2016 1244 07/04/2016 1835 06/25/2016 0409  LATICACIDVEN 4.7* 8.7* 9.2*    ABG No results for input(s): PHART, PCO2ART, PO2ART in the last 168 hours.  Liver Enzymes  Recent Labs Lab July 05, 2016 1222 07/08/2016 0648  AST 25 486*  ALT 14* 145*  ALKPHOS 86 74  BILITOT 0.8 0.7  ALBUMIN 3.5 3.0*    Cardiac Enzymes  Recent Labs Lab July 05, 2016 1506 06/26/2016 0415  TROPONINI 5.32* 4.64*    Glucose  Recent Labs Lab 06/25/2016 1853  07/06/2016 1931 07/10/2016 1957 06/13/2016 2333 07/06/2016 2359 07/03/2016 0411  GLUCAP 44* 23* 21* 20* 108* 108*    Imaging Dg Chest Port 1 View  Result Date: 06/29/2016 CLINICAL DATA:  Respiratory failure, peripheral vascular disease, atrial fibrillation, acute renal insufficiency. EXAM: PORTABLE CHEST 1 VIEW COMPARISON:  Portable chest x-ray of June 20, 2016 FINDINGS: The lungs are adequately inflated. The interstitial markings are mildly prominent. There is no alveolar infiltrate or pleural effusion. The cardiac silhouette is enlarged. The pulmonary vascularity is mildly prominent especially on the right. The observed bony thorax is unremarkable. IMPRESSION: Mild pulmonary interstitial prominence associated with cardiomegaly and mild central pulmonary vascular congestion is consistent with low-grade CHF. There is no alveolar pneumonia,  pleural effusion, or pneumothorax. When the patient can tolerate the procedure, a PA and lateral chest x-ray would be useful. Electronically Signed   By: David  SwazilandJordan M.D.   On: 06/13/2016 07:51   Dg Chest Port 1 View  Result Date: 06/27/2016 CLINICAL DATA:  Respiratory distress EXAM: PORTABLE CHEST 1 VIEW COMPARISON:  09/09/ 17 FINDINGS: The heart size is enlarged. No pleural effusion or edema. No airspace opacities. Lung volumes appear decreased. IMPRESSION: 1. No acute findings. Electronically Signed   By: Signa Kellaylor  Stroud M.D.   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 He was admitted as presumed stroke but ct's revealed multiple clots/occlusions and Afib. He was taken  to OR for embolectomies and will need to return to OR 9/10. Cardiology has seen him for new onset of A fib(possible cause of emboli). PCCM asked to assist due to K+ >7.0. He appears to be intravascular dry.   ASSESSMENT / PLAN:  PULMONARY A: Acute Hypoxic Respiratory Failure - Multifactorial. P:   Weaning FiO2 for Sat >92% Would get abg to ensure ph not a contribute to k  CARDIOVASCULAR A:  Atrial Fibrillation Elevated Troponin I Cardiomegaly Enlarged Main Pulmonary Artery Multiple Intra-Arterial Clots - S/P Thrombectomy 9/9 by Dr. Arbie CookeyEarly. H/O HTN P:  Cardiology Consulted Continuous Telemetry Monitoring Hydralazine prn IV- consider dc with rvr cardizem drip for rate control Heparin drip TTE pending  and needed  RENAL Lab Results  Component Value Date   CREATININE 3.72 (H) 06/27/2016   CREATININE 3.23 (H) 07/01/2016   CREATININE 3.00 (H) 07/06/2016    Recent Labs Lab 07/08/2016 2010 07/07/2016 2357 06/12/2016 0409  K 6.4* 6.8* 6.2*    A:   Renal insuff Hyperkalemia(suspect secondary to hypovolemia/hypoperfusion R/o renal art embolia Low output P:   IVF Kayexalate enema, consider to oral bmet q4h Bicarb drip to continue abg needed, ordered Heparin drip Renal art duplex needed Low output 20-30 cc in 24 hours, will place HD cath, seems inevitable will require renal replacement therapy  Bladder scan done  GASTROINTESTINAL A:   GI protection abdo pain P:   PPI US for RA thrombosis esclate narcs Get amy, lip,ldh abdo film  HEMATOLOGIC A:   Global emboli from presumed a fib Thrombocytopenia P:  Anticoagulation heparin drip ASA per Cards  for + trop  INFECTIOUS A:   No overt infection P:   Abx per surgery for prophylaxis   ENDOCRINE CBG (last 3)   Recent Labs  06/13/2016 2333 06/29/2016 2359 07/07/2016 0411  GLUCAP 20* 108* 108*     A:   Hyperglycemia hypoglcyemia error? On finger stick likely P:   Follow glucose Get cortisol  NEUROLOGIC A:   Follows commands. No sensation rt leg from knee down Neuro following for Linear infarcts on MRI that are asymptomatic  abdo pain P:   RASS goal: 0 ICU with neuro/vascular checks Consider dilaudid, failing morphine  FAMILY  - Updates: Pt updated 9/10. No family in room.  - Inter-disciplinary family meet or Palliative Care meeting due by:  9/17  Ccm time 35 min   Mcarthur Rossetti. Tyson Alias, MD, FACP Pgr: [phone] [facility]

## 2016-07-11 NOTE — Progress Notes (Signed)
CRITICAL VALUE ALERT  Critical value received:  Potassium 6.8  Date of notification:  06/30/2016  Time of notification:  0040  Critical value read back: Yes  Nurse who received alert:  Marlou PorchBradley Aubrianna Orchard   MD notified (1st page):  Arlean HoppingSchertz MD  Time of first page:  0045  MD notified (2nd page):  Time of second page:  Responding MD:  Arlean HoppingSchertz MD  Time MD responded: Marcie Bal0045  Marlou PorchBradley Adean Milosevic

## 2016-07-11 NOTE — Discharge Summary (Signed)
Discharge Summary    Luis Sellers 10-15-1940 75 y.o. male  [id number]  Admission Date: 2016-06-28  Discharge Date: 07/07/2016  Physician: No att. 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 male who is seen in the emergency department. He does very poor control of his health. He did not know that he was in atrial fibrillation. Reports that he fell this morning around 10 AM. Had weakness in his right arm and right leg was felt initially to have a potential stroke. Was found to have by CT angiogram occlusion of his right subclavian and both iliac vessels with reconstitution distally and the iliacs. Also has some thrombus in his renal artery and also inferior mesenteric artery. The physical exam he does have some weakness in his right arm. He is right-handed. He cannot feel her stress since his right foot at all and cannot stand or walk  Hospital Course:  Before being taken to the operating room, he was hyperkalemic and was treated with calcium gluconate and IVF.  He was started on a heparin gtt.   The patient was admitted to the hospital and taken to the operating room on 28-Jun-2016 and underwent:  #1 right iliac, femoral, superficial femoral and deep femoral embolectomy followed by intraoperative arteriogram and right popliteal artery exploration and tibial embolectomy #2 left iliac embolectomy #3 right arm subclavian exploration and thromboendarterectomy of chronic clot exploration of brachial artery    Later that evening/early morning, pt was alert in the ICU with a HR of 100 and systolic BP of 120.  He did have some oozing from the right subclavian incision, which stopped with pressure.  He had good grip strength in his right arm.  He continued to have diminished motor and sensory function in his right foot.  His troponin bumped  to 5.3 and cardiology consult was obtained.    On POD 1: His abdominal exam was benign.  Pt did have diminished pulses on the right leg, which was cool to touch and no motor or sensory function.  His right arm had good motor and sensory function with a weak radial signal.  His left arm had a diminished radial pulse. Critical care was consulted.   Dr. Arbie Cookey Explained to the patient that he does not have adequate flow for limb salvage of the right leg. Will plan on return to the OR for reexploration possible fasciotomy. Explained this all to the patient who understands. This was also discussed with Dr. Jamison Neighbor with critical care medicine. They will assist with the stabilization of the pt. Does not appear to have cardiogenic shock but would expect better flow to his left leg and now has diminished pulse in the left arm compared to preop.  The next morning, he remained hyperkalemic and received Kayexalate enema, calcium gluconate, saline bolus and IVF.   His creatinine was rising.  Cardiology consult recommended continuing heparin.  It was felt with his atrial fibrillation, the thrombosis source was most likely intracardiac.  He was started on metoprolol.  Possible cardioversion at a later time when pt more stable.   He has hx of hypertension and takes Lotensin at home.  His blood pressure has been low and given his acute kidney injury and hyperkalemia, this was held.   He was taken back to the OR and underwent:   Right popliteal exploration with the  thrombectomy of peroneal artery and vein patch angioplasty from popliteal down onto the posterior tibial artery. Lateral to compartment fasciotomy and extension of medial fasciotomy.    Pt did have a carotid duplex, which revealed no obvious evidence of hemodynamically significant ICA stenosis bilaterally.  Right vertebral artery patent.  Unable to visualize the left vertebral artery.    Later that day, his heart rate was having runs in the 130's-140's  otherwise, maintaining in the 110's-120's.  Pt currently on an insulin drip at 4 u/hr which was initiated in the OR to decrease potassium level.  Recommended that drip be discontinued.  Pt continues to have scant urine output.  Pt had received a total of 2L of boluses since this AM.  Bladder scan of pt was 73ml.  Elink advised.    Per cardiology, the troponins started to trend down.   A neurology consult was also obtained since in the ER, the pt got an MRI, which revealed acute cerebellar infarcts.  Neurology did not think these were responsible for his sx.   He was started on a statin.  Continue aspirin.  Pt counseled on smoking cessation as well as weight loss and diet and exercise.    Creatinine on admission was 1.10 and was up to 3.0 48 hrs later.  Nephrology was consulted.  He was having minimal uop.  His CPK was > 50k.   It was felt his acute kidney injury was due to rhabdomyolysis.   Pt's IVF was changed to 125cc/hr with bicarb gtt.  Kayexalate enemas were ordered q6h to treat hyperkalemia.  If K+ continued to worsen, he may need HD, but no indication at this time.   By POD 2, the pt was c/o abdominal pain.  He felt nauseated, but had not vomited.  Abdominal film, LFT's and LDH were ordered as well as renal artery duplex to r/o thrombus.  His creatinine was continuing to rise to 3.7. He did have brisk right peroneal doppler signal and faint left peroneal signal.  His fasciotomy dressings were removed and the medial is beefy red and lateral is pale with some venous ooze-manual pressure held for 20 minutes and dressing reapplied.    Cardizem gtt was continued for rate control.  He was also on heparin gtt.  HD catheter was to be placed as HD seems inevitable.  He had an echocardiogram with the following results: Study Conclusions  - Left ventricle: The cavity size was normal. There was mild   concentric hypertrophy. Systolic function was normal. The   estimated ejection fraction was in the  range of 55% to 60%. Wall   motion was normal; there were no regional wall motion   abnormalities. Acoustic contrast opacification revealed no   evidence ofthrombus. - Aortic valve: Valve mobility was mildly restricted. There was   very mild stenosis. Valve area (VTI): 1.59 cm^2. Valve area   (Vmax): 1.76 cm^2. Valve area (Vmean): 1.82 cm^2. - Mitral valve: Calcified annulus. Mildly thickened leaflets . - Left atrium: The atrium was moderately dilated. - Pulmonary arteries: PA peak pressure: 31 mm Hg (S).  Cardiology saw pt and felt the elevated troponin was most likely not related to ACS.  EKG showed septal Q's but no acute changes.   Pt with multisystem organ failure.  Later that morning, the pt had PEA arrest and under ACLS ressucitation.  When he was intubated, he had copious amounts of vomit in the airway.    He was unable to be revived and pronounced  dead. CBC    Component Value Date/Time   WBC 19.5 (H) 06/29/2016 0422   RBC 4.08 (L) 06/16/2016 0422   HGB 11.8 (L) 06/12/2016 0422   HCT 37.7 (L) 06/26/2016 0422   PLT 105 (L) 07/08/2016 0422   MCV 92.4 07/06/2016 0422   MCH 28.9 07/07/2016 0422   MCHC 31.3 06/19/2016 0422   RDW 14.6 06/14/2016 0422   LYMPHSABS 2.4 07/03/2016 1222   MONOABS 0.7 06/30/2016 1222   EOSABS 0.1 06/12/2016 1222   BASOSABS 0.0 06/14/2016 1222    BMET    Component Value Date/Time   NA 141 07/05/2016 0828   K 6.0 (H) 07/01/2016 0828   CL 107 07/04/2016 0828   CO2 18 (L) 07/03/2016 0828   GLUCOSE 122 (H) 07/06/2016 0828   BUN 40 (H) 06/29/2016 0828   CREATININE 4.18 (H) 06/15/2016 0828   CALCIUM 7.2 (L) 06/23/2016 0828   GFRNONAA 13 (L) 06/27/2016 0828   GFRAA 15 (L) 06/28/2016 0828     Discharge Diagnosis:  Stroke (cerebrum) (HCC) [I63.9] New onset atrial fibrillation (HCC) [I48.91] 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 Acute respiratory  failure (HCC)   . Cerebral embolism with cerebral infarction 2016-01-09  . HLD (hyperlipidemia)   . AKI (acute kidney injury) (HCC)   . Hyperkalemia   . Arterial thrombosis (HCC) 06/27/2016  . Limb ischemia 06/17/2016  . Atrial fibrillation (HCC) 06/30/2016  . HTN (hypertension) 07/09/2016  . Embolus (HCC) 06/17/2016   Past Medical History:  Diagnosis Date  . Hypertension    not on any antihypertensives     Prescriptions given: none  Disposition: death   Doreatha MassedSamantha Saphia Vanderford, PA-C [facility] [phone] 06/23/2016  10:24 AM

## 2016-07-11 NOTE — Progress Notes (Signed)
  Echocardiogram 2D Echocardiogram with Definity has been performed.  Luis Sellers, Luis Sellers, Luis Sellers

## 2016-07-11 NOTE — Consult Note (Addendum)
Renal Service Consult Note [facility]  Luis Sellers 06/29/2016 Luis Sellers Requesting Physician:  Dr Arbie Cookey  Reason for Consult:  AKI, high K+ HPI: The patient is a 75 y.o. year-old with history of HTN presented to ED on 9/9 with complaint of new onset numbness in the R leg < 1 hrs duration.  In ED had new onset afib, difference in BP's from L to R arm.  MRI showed acute cerebellar CVA, and CTA showed occlusion of R subclavian artery and both iliac arteries, also thrombus in inferior mesenteric and renal artery . Seen by vasc surg and taken to OR and underwent clot removal/ embolectomy of multiple vessels in the R iliac and lower leg, the L iliac and the R subclavian artery.  Went back to OR on 9/10 for persistent RLE ischemia and underwent clot removal from R peroneal artery and fasciotomy.   Creat was 1.10 on admission and is up to 3.00 now 48 hrs later.  UOP is minimal today, was good yest about 2000 ml.  Brown urine. No hx kidney problems.  K was 7.0 at 6:45 am, 6.8 at 6:30 pm and 6.4 at 8:20 pm today.  Serum CO2 was 19 yest, 18 this am and 13 this evening.  CPK > 50K.    BP 158/99, HR 72  RR 17  Temp 98.    Patient w/o specific complaints, no hx renal disease. No SOB or CP now. No UA sent yet.      Past Medical History  Past Medical History:  Diagnosis Date  . Hypertension    not on any antihypertensives   Past Surgical History  Past Surgical History:  Procedure Laterality Date  . EYE SURGERY    . NASAL SINUS SURGERY     Family History No family history on file. Social History  reports that he has quit smoking. His smoking use included Cigarettes. He does not have any smokeless tobacco history on file. His alcohol and drug histories are not on file. Allergies  Allergies  Allergen Reactions  . Lactose Intolerance (Gi) Diarrhea and Other (See Comments)    Stomach pains  . Penicillins Rash    Has patient had a PCN reaction causing immediate rash,  facial/tongue/throat swelling, SOB or lightheadedness with hypotension: Yes Has patient had a PCN reaction causing severe rash involving mucus membranes or skin necrosis: No Has patient had a PCN reaction that required hospitalization No Has patient had a PCN reaction occurring within the last 10 years: No If all of the above answers are "NO", then may proceed with Cephalosporin use.   Home medications Prior to Admission medications   Medication Sig Start Date End Date Taking? Authorizing Provider  acetaminophen (TYLENOL) 650 MG CR tablet Take 650-1,300 mg by mouth every 8 (eight) hours as needed for pain.   Yes Historical Provider, MD  GLUCOSAMINE-CHONDROITIN PO Take 1 tablet by mouth daily.   Yes Historical Provider, MD  Polyvinyl Alcohol-Povidone (REFRESH OP) Place 1 drop into both eyes daily as needed (dry eyes/ irritation).   Yes Historical Provider, MD  benazepril-hydrochlorthiazide (LOTENSIN HCT) 20-25 MG per tablet Take 1 tablet by mouth daily. Patient not taking: Reported on 06-25-16 08/06/11   Denna Haggard, MD   Liver Function Tests  Recent Labs Lab 07/10/2016 1222 06-25-2016 0648  AST 25 486*  ALT 14* 145*  ALKPHOS 86 74  BILITOT 0.8 0.7  PROT 6.6 5.6*  ALBUMIN 3.5 3.0*   No results for input(s): LIPASE, AMYLASE in  the last 168 hours. CBC  Recent Labs Lab Feb 21, 2016 1222  06/27/2016 0415 07/04/2016 1139 07/08/2016 1239  WBC 10.1  --  17.1*  --   --   NEUTROABS 6.8  --   --   --   --   HGB 14.4  < > 14.5 12.9* 13.6  HCT 45.2  < > 44.3 38.0* 40.0  MCV 90.6  --  90.4  --   --   PLT 173  --  110*  --   --   < > = values in this interval not displayed. Basic Metabolic Panel  Recent Labs Lab Feb 21, 2016 1222 Feb 21, 2016 1230 07/09/2016 0415 07/03/2016 0645 07/06/2016 1139 06/29/2016 1239 06/17/2016 1556 06/29/2016 1835 06/22/2016 2010  NA 139 143 138  --  138 138 139 141 133*  K 3.2* 3.2* 7.3* 7.0* 7.2* 7.1* 6.4* 6.8* 6.4*  CL 110 106 105  --   --   --  108 109 106  CO2 19*  --   18*  --   --   --  20* 17* 13*  GLUCOSE 195* 194* 147*  --  131* 104* 95 59* 131*  BUN 19 21* 21*  --   --   --  31* 31* 33*  CREATININE 1.11 1.10 1.52*  --   --   --  2.96* 2.92* 3.00*  CALCIUM 8.9  --  7.9*  --   --   --  7.1* 7.4* 6.7*   Iron/TIBC/Ferritin/ %Sat No results found for: IRON, TIBC, FERRITIN, IRONPCTSAT  Vitals:   06/30/2016 2300 06/23/2016 2334 07/04/2016 2355 07/04/2016 0000  BP: (!) 158/99  (!) 162/88 (!) 164/101  Pulse: 66   70  Resp: 16   17  Temp:  98 F (36.7 C)    TempSrc:  Oral    SpO2: 98%   97%  Weight:      Height:       Exam Gen alert, no distress No rash, cyanosis or gangrene Sclera anicteric, throat clear  No jvd or bruits Chest clear bilat Cor irreg irreg no MRG Abd soft ntnd no mass or ascites +bs +obese no wounds GU normal male w foley draining dark brown urine minimal amount MS dressings on R & L leg, RUE Ext 1+ edema LE's  / no wounds or ulcers Neuro is alert, Ox 3 , nf   CXR - no edema/ infiltrates CTA of chest/ abd/ pelv - kidneys looked a little small or scalloped Na 133 K 6.4  CO2 13  BUN 33  Cr 3.00  Ca 6.7  Glu 131  AG 14  EKG 10 pm > afib, QRS 90 msec EKG 8 am > afib w RVR 110, QRS 88 msec  Assessment: 1  Hyperkalemia - K 6.3 w/o sig EKG changes 2  Rhabdomyolysis 3  Acute kidney injury from #2 4  Volume - looks euvolemic 5  New afib w multiple embolic events to RLE/ LLE/ RUE, brain (CVA)- SP OR x 2   Plan - change IVF"s to 125 cc/hr w bicarb gtt.  Consider placing HD cath in case hyperkalemia worsens. Check K every 4 hours until stable and under 5.5.  Ordered q 6 hr kayexalate enemas to get K down.  May need HD if K worsens in spite of these measures, no indication at this time. Will follow.    Vinson Moselleob Marshell Rieger MD [facility] pager [phone]    cell [phone] 06/12/2016, 12:19 AM

## 2016-07-11 NOTE — Progress Notes (Signed)
PT Cancellation Note  Patient Details Name: Luis MilesJohn Sellers MRN: [id number] DOB: 10/02/1941   Cancelled Treatment:    Reason Eval/Treat Not Completed: Other (comment); patient coded this morning.  Will sign off.    Elray McgregorCynthia Shynice Sigel 07/07/2016, 11:47 AM  Sheran Lawlessyndi Jerra Huckeby, PT [phone] 06/25/2016

## 2016-07-11 NOTE — Progress Notes (Signed)
   Subjective:  [name] was seen and evaluated today at bedside. He was in no distress. He reports his 2nd surgery went well yesterday. He has no complaints except for some abdominal pain and feels gassy. Reports he is regaining sensation in his extremities and is able to move all 4 of them.  Reports last BM last night and was regular.  Objective:  Vital signs in last 24 hours: Vitals:   02-29-16 0500 02-29-16 0600 02-29-16 0700 02-29-16 0800  BP: (!) 151/86 (!) 152/108 (!) 147/95 (!) 174/88  Pulse: (!) 52 64 (!) 55 (!) 116  Resp: 17 16 17 19   Temp:      TempSrc:      SpO2: 97% 94% 95% 96%  Weight:      Height:       Scheduled medications: .  stroke: mapping our early stages of recovery book   Does not apply Once  . aspirin EC  81 mg Oral Daily  . dextrose  50 mL Intravenous Once  . docusate sodium  100 mg Oral Daily  . mouth rinse  15 mL Mouth Rinse BID  . sodium polystyrene  45 g Rectal Q6H   Physical Exam  Constitutional: He is oriented to person, place, and time and well-developed, well-nourished, and in no distress.  Cardiovascular:  Irregularly irregular, rate 112. No murmur or rub Pulses diminished in BL LE and feel cold to touch. Some edema present as well.  Pulses blunted but present in BL UE. Feel cold to touch.   Pulmonary/Chest: Effort normal and breath sounds normal. No respiratory distress. He has no wheezes.  Abdominal: Soft. Bowel sounds are normal. He exhibits no distension. There is tenderness.  Musculoskeletal:  Clean dry dressing in place over RLE fasciotomy site. No drainage noted.  Steri strips in place over RUE, R chest. No purulent drainage.   Neurological: He is alert and oriented to person, place, and time.  Skin: He is not diaphoretic.    Assessment/Plan:  Active Problems:   Arterial thrombosis (HCC)   Limb ischemia   Atrial fibrillation (HCC)   HTN (hypertension)   Thrombus   Cerebral embolism with cerebral infarction   HLD  (hyperlipidemia)   AKI (acute kidney injury) (HCC)   Hyperkalemia  Arterial Thromboembolism with 4 Limb Critical Ischemia Patient taken to OR yesterday and day prior for thrombectomy and limb salvage attempts. Patient denies any complications from procedures. Reports he is regaining strength and sensation in all 4 extremities however still feel cold to touch. BL LE without palpable pulses or audible pulses on doppler.  -HbA1c 5.9 -ECHO pending -Lactic acid currently 9.2, trending -Vascular on -Cardiology on  Rhabdomyolysis -CK >50,000 x2 -Cr. Currently 3.7, was normal on admission -Fluids running per nephro -Will continue to monitor  Hyperkalemia Peak 7.3 after first surgery. EKG showed significantly peaked t-waves Currently 6.2 with administration of kayexelate, insulin and calcium gluconate -EKG from this morning still showing peaked t-waves -K level Q4H  Atrial Fibrillation New diagnosis for pt. Several months of palpitations. Has been on Heparin ggt for sevearl days due to extensive thrombosis and need for multiple operations -CHADS2VASc = 6; needs lifelong anticoagulation -Cardiology recs 25 mg metoprolol daily for rate control. Could be cardioverted once stable and current illness resolved -Continuing Heparin ggt=  Dispo: Anticipated discharge in approximately 4-5 day(s).   Luis Diehl, DO 2016/04/04, 8:35 AM Pager: [phone]

## 2016-07-11 NOTE — Progress Notes (Signed)
Made contact with patients daughter Brenda Miller and made her aware with Dr. Tyson AliTylene FantasiaasFeinstein of failed resuscitation event and offered support.  Tylene FantasiaBrenda Miller [phone].  Jacqulyn Canehristopher Scott Liandro Thelin RN, BSN, CCRN

## 2016-07-11 NOTE — Progress Notes (Signed)
CRITICAL VALUE ALERT  Critical value received:  Lactic Acid 9.2  Date of notification:  06/26/2016   Time of notification:  0555  Critical value read back: Yes  Nurse who received alert:  Marlou PorchBradley Darcia Lampi   MD notified (1st page):  E-Link MD  Time of first page:  0555  MD notified (2nd page):  Time of second page:  Responding MD:  E-Link MD  Time MD responded:  [phone]

## 2016-07-11 NOTE — Progress Notes (Signed)
Patients Daughter presented to the hospital.  I returned his belongings from the bedside to her (Black cell phone, wallet with keys & cards in it, pants, & belt.).   Daughter has requested to see her father.  Hassie BruceKim AC and myself will support this request.    Jacqulyn Canehristopher Scott Nicolette Gieske RN, BSN, CCRN

## 2016-07-11 NOTE — Progress Notes (Signed)
E-Link MD paged regarding patient's morning Calcium level. Awaiting return call.   Luis PorchBradley Jaynia Fendley

## 2016-07-11 DEATH — deceased

## 2018-02-03 IMAGING — CR DG CHEST 1V PORT
1 series · 1 of 1 positions shown · non-contrast
Comparison: Portable chest x-ray June 20, 2016

CLINICAL DATA: Respiratory failure, peripheral vascular disease,
atrial fibrillation, acute renal insufficiency.

EXAM:
PORTABLE CHEST 1 VIEW

[AP]
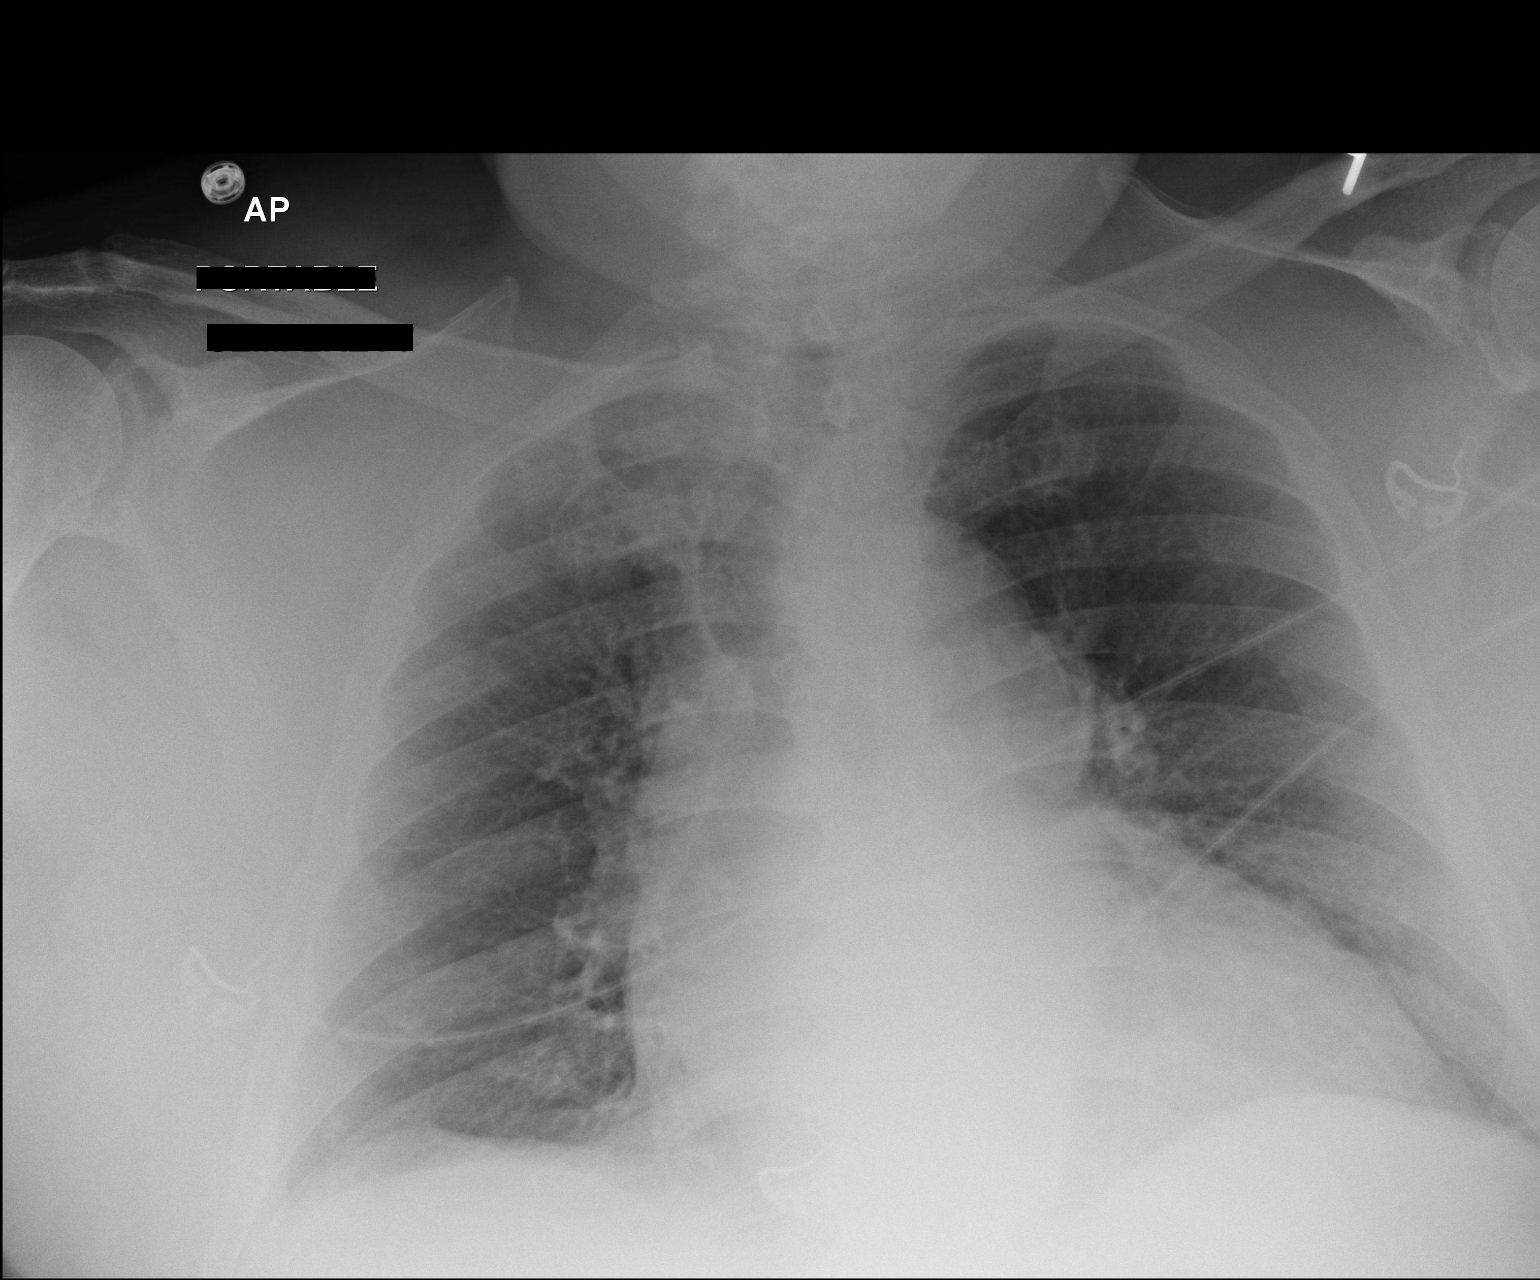

[1 of 1 positions shown; findings below may reference images not displayed]

FINDINGS: The lungs are adequately inflated. The interstitial markings are
mildly prominent. There is no alveolar infiltrate or pleural
effusion. The cardiac silhouette is enlarged. The pulmonary
vascularity is mildly prominent especially on the right. The
observed bony thorax is unremarkable.
IMPRESSION: Mild pulmonary interstitial prominence associated with cardiomegaly
and mild central pulmonary vascular congestion is consistent with
low-grade CHF. There is no alveolar pneumonia, pleural effusion, or
pneumothorax. When the patient can tolerate the procedure, a PA and
lateral chest x-ray would be useful.

## 2018-02-03 IMAGING — CR DG ABD PORTABLE 1V
2 series · 2 of 2 positions shown · non-contrast
Comparison: None.

CLINICAL DATA: Abdominal pain and distention.

EXAM:
PORTABLE ABDOMEN - 1 VIEW

[AP (1 of 2)]
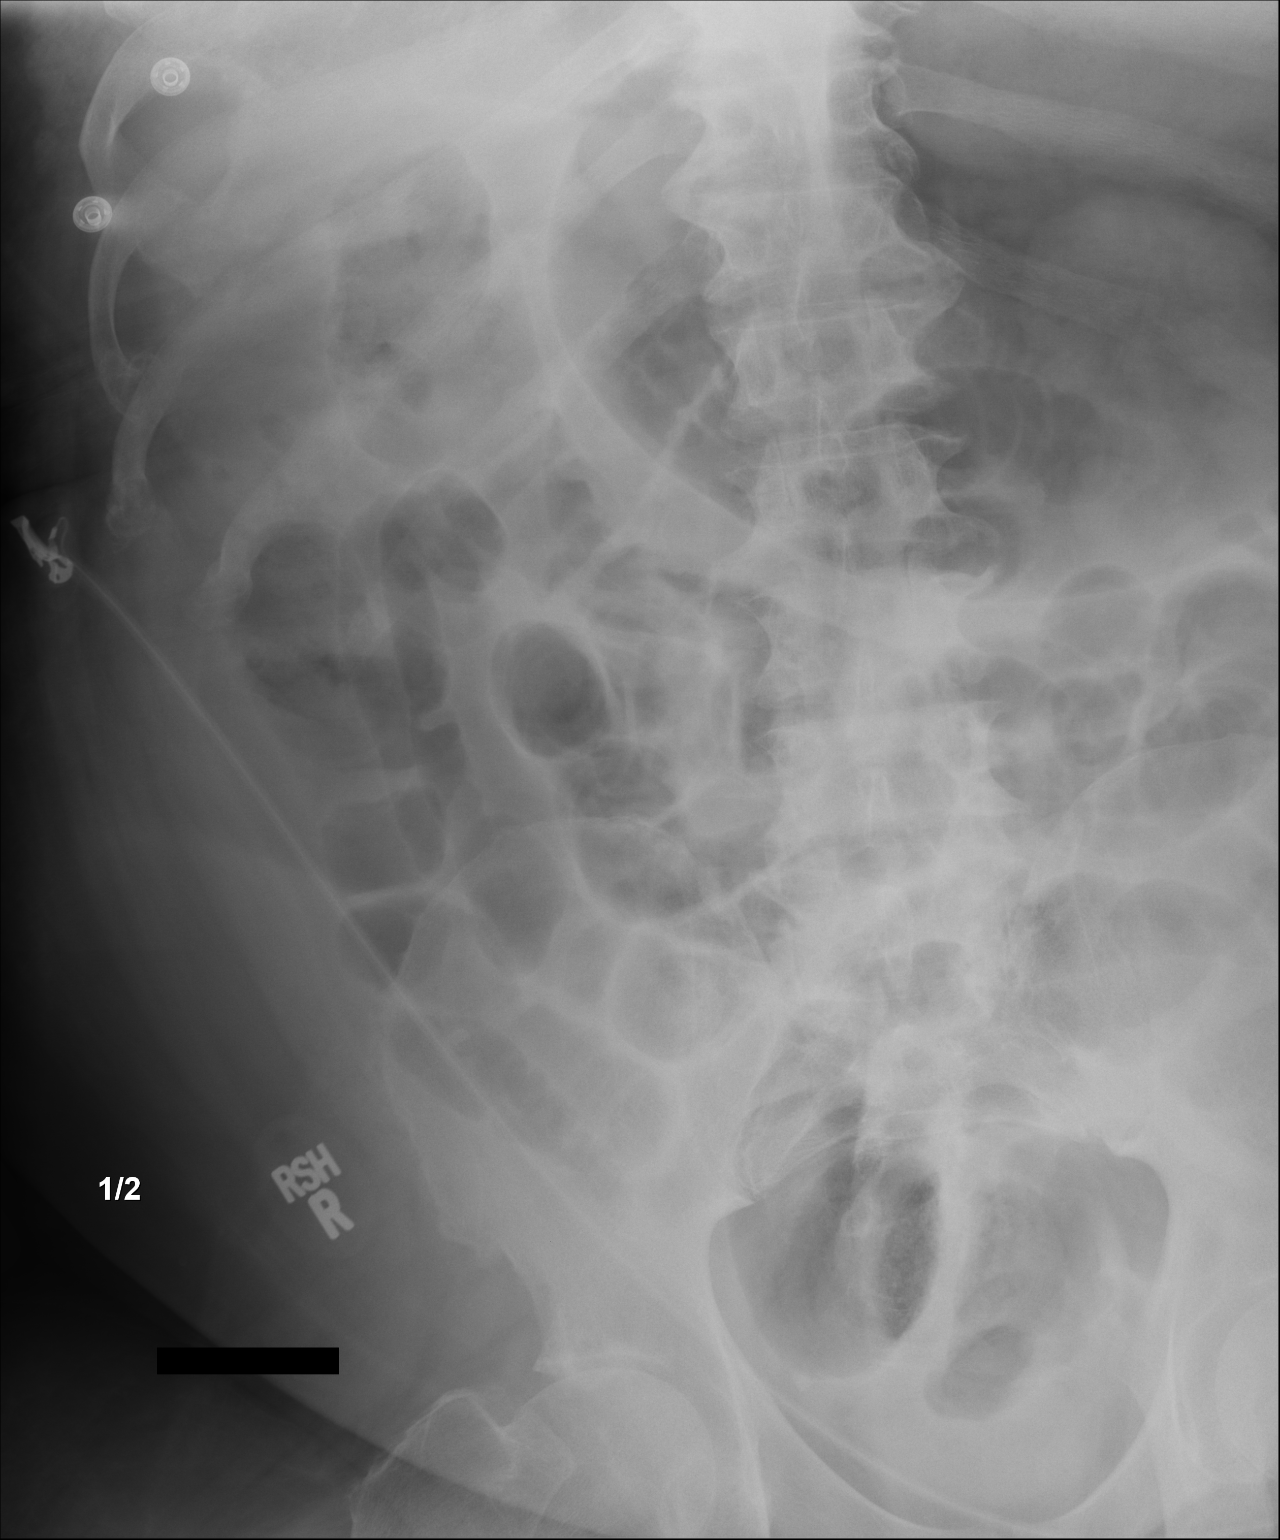

[AP (2 of 2)]
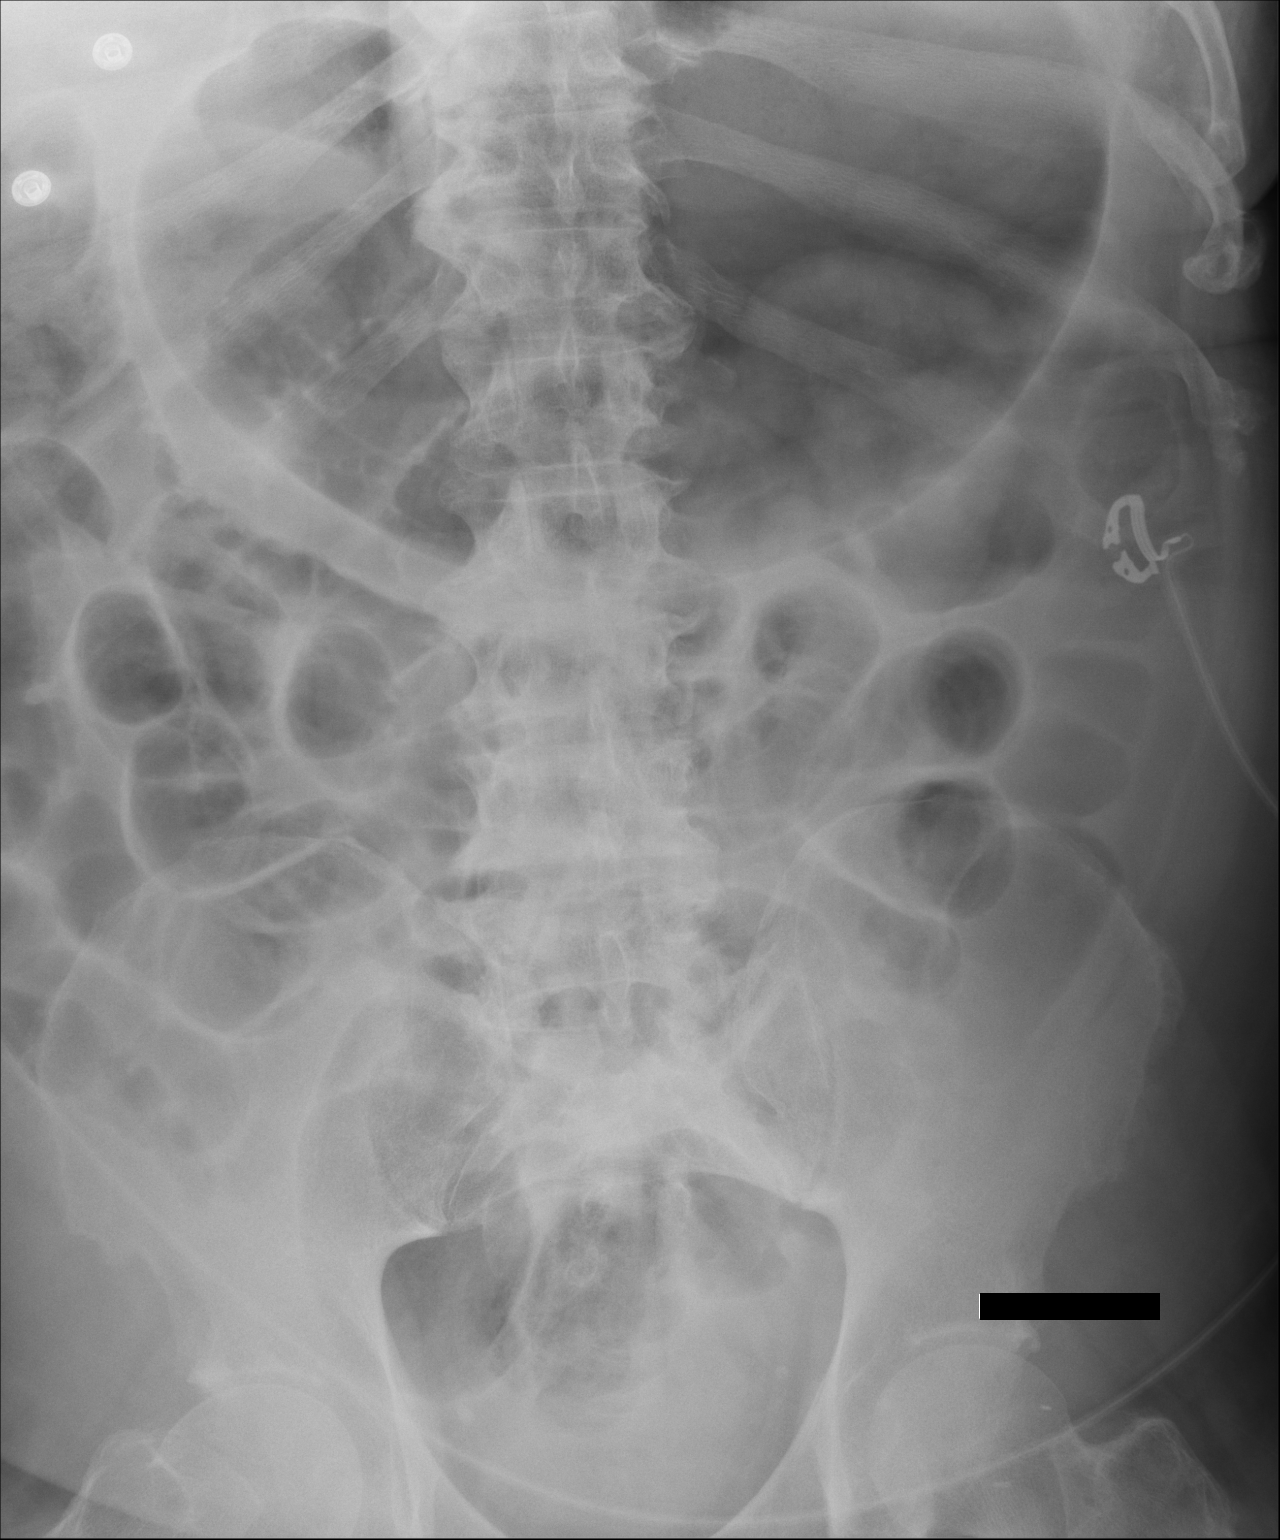

[2 of 2 positions shown; findings below may reference images not displayed]

FINDINGS: There is marked gaseous distention of the stomach and air distended
small bowel and colon. Findings most consistent with a diffuse
adynamic ileus. No obvious free air.
IMPRESSION: Dilated air-filled stomach, small bowel and colon suggesting a
diffuse ileus.
